# Patient Record
Sex: Female | Born: 1985 | Race: White | Hispanic: No | Marital: Single | Smoking: Never smoker
Health system: Southern US, Community
[De-identification: ages and names within clinical notes are randomized; demographics above are authoritative.]

## PROBLEM LIST (undated history)

## (undated) DIAGNOSIS — I82409 Acute embolism and thrombosis of unspecified deep veins of unspecified lower extremity: Secondary | ICD-10-CM

## (undated) DIAGNOSIS — M1990 Unspecified osteoarthritis, unspecified site: Secondary | ICD-10-CM

## (undated) HISTORY — DX: Unspecified osteoarthritis, unspecified site: M19.90

---
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
## 2007-12-02 ENCOUNTER — Emergency Department: Payer: Self-pay | Admitting: Emergency Medicine

## 2008-07-24 ENCOUNTER — Emergency Department: Payer: Self-pay | Admitting: Emergency Medicine

## 2008-07-25 ENCOUNTER — Emergency Department: Payer: Self-pay | Admitting: Emergency Medicine

## 2009-08-02 ENCOUNTER — Encounter: Payer: Self-pay | Admitting: Obstetrics and Gynecology

## 2009-08-16 ENCOUNTER — Encounter: Payer: Self-pay | Admitting: Maternal and Fetal Medicine

## 2009-10-15 ENCOUNTER — Observation Stay: Payer: Self-pay | Admitting: Obstetrics and Gynecology

## 2009-10-16 ENCOUNTER — Ambulatory Visit: Payer: Self-pay

## 2009-11-01 ENCOUNTER — Encounter: Payer: Self-pay | Admitting: Obstetrics & Gynecology

## 2009-11-05 ENCOUNTER — Encounter: Payer: Self-pay | Admitting: Maternal and Fetal Medicine

## 2009-11-08 ENCOUNTER — Encounter: Payer: Self-pay | Admitting: Obstetrics and Gynecology

## 2009-11-15 ENCOUNTER — Encounter: Payer: Self-pay | Admitting: Maternal and Fetal Medicine

## 2009-11-15 ENCOUNTER — Observation Stay: Payer: Self-pay

## 2016-05-20 DIAGNOSIS — N920 Excessive and frequent menstruation with regular cycle: Secondary | ICD-10-CM

## 2016-05-20 DIAGNOSIS — D649 Anemia, unspecified: Secondary | ICD-10-CM

## 2016-05-20 DIAGNOSIS — Z832 Family history of diseases of the blood and blood-forming organs and certain disorders involving the immune mechanism: Secondary | ICD-10-CM

## 2016-06-16 DIAGNOSIS — M1990 Unspecified osteoarthritis, unspecified site: Secondary | ICD-10-CM | POA: Insufficient documentation

## 2016-06-30 ENCOUNTER — Inpatient Hospital Stay (HOSPITAL_COMMUNITY): Admission: RE | Admit: 2016-06-30 | Payer: Self-pay | Source: Ambulatory Visit

## 2016-07-18 DIAGNOSIS — M0600 Rheumatoid arthritis without rheumatoid factor, unspecified site: Secondary | ICD-10-CM | POA: Insufficient documentation

## 2016-07-18 DIAGNOSIS — Z79899 Other long term (current) drug therapy: Secondary | ICD-10-CM | POA: Insufficient documentation

## 2016-07-31 DIAGNOSIS — D638 Anemia in other chronic diseases classified elsewhere: Secondary | ICD-10-CM

## 2016-07-31 DIAGNOSIS — M069 Rheumatoid arthritis, unspecified: Secondary | ICD-10-CM

## 2017-02-04 DIAGNOSIS — M069 Rheumatoid arthritis, unspecified: Secondary | ICD-10-CM

## 2017-02-04 DIAGNOSIS — D509 Iron deficiency anemia, unspecified: Secondary | ICD-10-CM | POA: Diagnosis not present

## 2017-02-04 DIAGNOSIS — D638 Anemia in other chronic diseases classified elsewhere: Secondary | ICD-10-CM | POA: Diagnosis not present

## 2017-06-29 DIAGNOSIS — M0600 Rheumatoid arthritis without rheumatoid factor, unspecified site: Secondary | ICD-10-CM | POA: Diagnosis not present

## 2017-07-17 DIAGNOSIS — Z1231 Encounter for screening mammogram for malignant neoplasm of breast: Secondary | ICD-10-CM | POA: Diagnosis not present

## 2017-08-04 DIAGNOSIS — D638 Anemia in other chronic diseases classified elsewhere: Secondary | ICD-10-CM

## 2017-08-04 DIAGNOSIS — D509 Iron deficiency anemia, unspecified: Secondary | ICD-10-CM

## 2017-08-04 DIAGNOSIS — M069 Rheumatoid arthritis, unspecified: Secondary | ICD-10-CM

## 2017-08-25 DIAGNOSIS — Z79899 Other long term (current) drug therapy: Secondary | ICD-10-CM | POA: Diagnosis not present

## 2017-08-25 DIAGNOSIS — M1990 Unspecified osteoarthritis, unspecified site: Secondary | ICD-10-CM | POA: Diagnosis not present

## 2017-08-25 DIAGNOSIS — M0600 Rheumatoid arthritis without rheumatoid factor, unspecified site: Secondary | ICD-10-CM | POA: Diagnosis not present

## 2017-10-20 DIAGNOSIS — M0600 Rheumatoid arthritis without rheumatoid factor, unspecified site: Secondary | ICD-10-CM | POA: Diagnosis not present

## 2017-10-20 DIAGNOSIS — M0579 Rheumatoid arthritis with rheumatoid factor of multiple sites without organ or systems involvement: Secondary | ICD-10-CM | POA: Diagnosis not present

## 2017-12-28 DIAGNOSIS — M0600 Rheumatoid arthritis without rheumatoid factor, unspecified site: Secondary | ICD-10-CM | POA: Diagnosis not present

## 2017-12-28 DIAGNOSIS — M1990 Unspecified osteoarthritis, unspecified site: Secondary | ICD-10-CM | POA: Diagnosis not present

## 2017-12-28 DIAGNOSIS — Z79899 Other long term (current) drug therapy: Secondary | ICD-10-CM | POA: Diagnosis not present

## 2018-01-15 DIAGNOSIS — J189 Pneumonia, unspecified organism: Secondary | ICD-10-CM | POA: Diagnosis not present

## 2018-01-15 DIAGNOSIS — M069 Rheumatoid arthritis, unspecified: Secondary | ICD-10-CM | POA: Diagnosis not present

## 2018-03-09 DIAGNOSIS — J019 Acute sinusitis, unspecified: Secondary | ICD-10-CM | POA: Diagnosis not present

## 2018-03-09 DIAGNOSIS — R05 Cough: Secondary | ICD-10-CM | POA: Diagnosis not present

## 2018-03-09 DIAGNOSIS — Z6841 Body Mass Index (BMI) 40.0 and over, adult: Secondary | ICD-10-CM | POA: Diagnosis not present

## 2018-04-12 DIAGNOSIS — Z79899 Other long term (current) drug therapy: Secondary | ICD-10-CM | POA: Diagnosis not present

## 2018-04-12 DIAGNOSIS — M0600 Rheumatoid arthritis without rheumatoid factor, unspecified site: Secondary | ICD-10-CM | POA: Diagnosis not present

## 2018-04-12 DIAGNOSIS — M1990 Unspecified osteoarthritis, unspecified site: Secondary | ICD-10-CM | POA: Diagnosis not present

## 2018-04-21 DIAGNOSIS — B342 Coronavirus infection, unspecified: Secondary | ICD-10-CM | POA: Diagnosis not present

## 2018-06-07 DIAGNOSIS — M0600 Rheumatoid arthritis without rheumatoid factor, unspecified site: Secondary | ICD-10-CM | POA: Diagnosis not present

## 2018-08-02 DIAGNOSIS — M1990 Unspecified osteoarthritis, unspecified site: Secondary | ICD-10-CM | POA: Diagnosis not present

## 2018-08-02 DIAGNOSIS — M0600 Rheumatoid arthritis without rheumatoid factor, unspecified site: Secondary | ICD-10-CM | POA: Diagnosis not present

## 2018-08-02 DIAGNOSIS — Z79899 Other long term (current) drug therapy: Secondary | ICD-10-CM | POA: Diagnosis not present

## 2018-09-27 DIAGNOSIS — M0600 Rheumatoid arthritis without rheumatoid factor, unspecified site: Secondary | ICD-10-CM | POA: Diagnosis not present

## 2018-11-22 DIAGNOSIS — M0600 Rheumatoid arthritis without rheumatoid factor, unspecified site: Secondary | ICD-10-CM | POA: Diagnosis not present

## 2018-12-17 DIAGNOSIS — Z20828 Contact with and (suspected) exposure to other viral communicable diseases: Secondary | ICD-10-CM | POA: Diagnosis not present

## 2019-01-01 DIAGNOSIS — Z20828 Contact with and (suspected) exposure to other viral communicable diseases: Secondary | ICD-10-CM | POA: Diagnosis not present

## 2019-01-11 DIAGNOSIS — M0600 Rheumatoid arthritis without rheumatoid factor, unspecified site: Secondary | ICD-10-CM | POA: Diagnosis not present

## 2019-03-07 DIAGNOSIS — M0600 Rheumatoid arthritis without rheumatoid factor, unspecified site: Secondary | ICD-10-CM | POA: Diagnosis not present

## 2019-05-02 DIAGNOSIS — M25561 Pain in right knee: Secondary | ICD-10-CM | POA: Insufficient documentation

## 2019-05-02 DIAGNOSIS — R0781 Pleurodynia: Secondary | ICD-10-CM | POA: Insufficient documentation

## 2019-05-02 DIAGNOSIS — M1990 Unspecified osteoarthritis, unspecified site: Secondary | ICD-10-CM | POA: Diagnosis not present

## 2019-05-02 DIAGNOSIS — M0600 Rheumatoid arthritis without rheumatoid factor, unspecified site: Secondary | ICD-10-CM | POA: Diagnosis not present

## 2019-05-02 DIAGNOSIS — Z79899 Other long term (current) drug therapy: Secondary | ICD-10-CM | POA: Diagnosis not present

## 2019-05-31 DIAGNOSIS — Z6841 Body Mass Index (BMI) 40.0 and over, adult: Secondary | ICD-10-CM | POA: Diagnosis not present

## 2019-05-31 DIAGNOSIS — R600 Localized edema: Secondary | ICD-10-CM | POA: Diagnosis not present

## 2019-06-01 DIAGNOSIS — I82431 Acute embolism and thrombosis of right popliteal vein: Secondary | ICD-10-CM | POA: Diagnosis not present

## 2019-06-01 DIAGNOSIS — I82411 Acute embolism and thrombosis of right femoral vein: Secondary | ICD-10-CM | POA: Diagnosis not present

## 2019-06-01 DIAGNOSIS — R600 Localized edema: Secondary | ICD-10-CM | POA: Diagnosis not present

## 2019-06-07 DIAGNOSIS — I824Y1 Acute embolism and thrombosis of unspecified deep veins of right proximal lower extremity: Secondary | ICD-10-CM | POA: Diagnosis not present

## 2019-06-07 DIAGNOSIS — M069 Rheumatoid arthritis, unspecified: Secondary | ICD-10-CM | POA: Diagnosis not present

## 2019-06-07 DIAGNOSIS — Z6841 Body Mass Index (BMI) 40.0 and over, adult: Secondary | ICD-10-CM | POA: Diagnosis not present

## 2019-06-13 ENCOUNTER — Other Ambulatory Visit (INDEPENDENT_AMBULATORY_CARE_PROVIDER_SITE_OTHER): Payer: Self-pay | Admitting: Vascular Surgery

## 2019-06-13 DIAGNOSIS — I82411 Acute embolism and thrombosis of right femoral vein: Secondary | ICD-10-CM

## 2019-06-13 DIAGNOSIS — M0600 Rheumatoid arthritis without rheumatoid factor, unspecified site: Secondary | ICD-10-CM | POA: Diagnosis not present

## 2019-06-14 ENCOUNTER — Ambulatory Visit (INDEPENDENT_AMBULATORY_CARE_PROVIDER_SITE_OTHER): Payer: BC Managed Care – PPO | Admitting: Vascular Surgery

## 2019-06-14 ENCOUNTER — Encounter (INDEPENDENT_AMBULATORY_CARE_PROVIDER_SITE_OTHER): Payer: Self-pay | Admitting: Vascular Surgery

## 2019-06-14 ENCOUNTER — Ambulatory Visit (INDEPENDENT_AMBULATORY_CARE_PROVIDER_SITE_OTHER): Payer: BC Managed Care – PPO

## 2019-06-14 ENCOUNTER — Other Ambulatory Visit
Admission: RE | Admit: 2019-06-14 | Discharge: 2019-06-14 | Disposition: A | Discharge status: Home / Self Care | Payer: BC Managed Care – PPO | Source: Ambulatory Visit | Attending: Vascular Surgery | Admitting: Vascular Surgery

## 2019-06-14 ENCOUNTER — Telehealth (INDEPENDENT_AMBULATORY_CARE_PROVIDER_SITE_OTHER): Payer: Self-pay

## 2019-06-14 ENCOUNTER — Other Ambulatory Visit (INDEPENDENT_AMBULATORY_CARE_PROVIDER_SITE_OTHER): Payer: Self-pay | Admitting: Nurse Practitioner

## 2019-06-14 ENCOUNTER — Encounter (INDEPENDENT_AMBULATORY_CARE_PROVIDER_SITE_OTHER): Payer: Self-pay

## 2019-06-14 ENCOUNTER — Other Ambulatory Visit: Payer: Self-pay

## 2019-06-14 VITALS — BP 109/73 | HR 97 | Ht 64.0 in | Wt 302.0 lb

## 2019-06-14 DIAGNOSIS — M0600 Rheumatoid arthritis without rheumatoid factor, unspecified site: Secondary | ICD-10-CM | POA: Diagnosis not present

## 2019-06-14 DIAGNOSIS — Z01812 Encounter for preprocedural laboratory examination: Secondary | ICD-10-CM | POA: Diagnosis not present

## 2019-06-14 DIAGNOSIS — I82409 Acute embolism and thrombosis of unspecified deep veins of unspecified lower extremity: Secondary | ICD-10-CM | POA: Insufficient documentation

## 2019-06-14 DIAGNOSIS — I82411 Acute embolism and thrombosis of right femoral vein: Secondary | ICD-10-CM

## 2019-06-14 DIAGNOSIS — Z20822 Contact with and (suspected) exposure to covid-19: Secondary | ICD-10-CM | POA: Insufficient documentation

## 2019-06-14 NOTE — Assessment & Plan Note (Signed)
I have discussed the case with her rheumatologist who is comfortable with [place] proceeding with a percutaneous procedure at this time.  She has recently gotten infusion for rheumatoid arthritis and her rheumatoid arthritis does make it harder for her to put on compression stockings

## 2019-06-14 NOTE — Telephone Encounter (Signed)
Patient was seen in office today and scheduled for a right leg venous thrombectomy with Dr. Wyn Quaker for 06/15/19 with a 1:00 pm arrival time to the MM. Patient will do covid testing on 06/14/19 before 1:00 pm. Pre-procedure instructions were discussed and the paperwork handed to the patient. Patient did state she was calling her Rheumatologist to make sure it was okay to have the procedure.

## 2019-06-14 NOTE — Progress Notes (Signed)
Patient ID: Kathleen Lindsey, female   DOB: 05-28-1985, 34 y.o.   MRN: [id number]  Chief Complaint  Patient presents with  . New Patient (Initial Visit)    DVT on anti coagulate    HPI Kathleen Lindsey is a 34 y.o. female.  I am asked to see the patient by N. Tobie Lords, [place] for evaluation of a DVT.  A little over 2 weeks ago, the patient began developing numbness, swelling, and pain in the right leg.  This was somewhat out of the blue.  This followed a minor trauma to the right leg.  She had no previous history of DVT or superficial thrombophlebitis to her knowledge.  She had been getting treatment for rheumatoid arthritis and gets regular infusions.  This prompted a DVT study which showed extensive right lower extremity DVT throughout the visualized veins of the right lower extremity at that time.  She was started on anticoagulation which has resulted in mild improvement in her symptoms over the past 2 weeks.  She is referred and she is seen today for further evaluation and treatment.  We repeated an ultrasound today for further evaluation.  The patient has a fairly extensive right lower extremity DVT.  Although she has had some resolution of the popliteal portion of her DVT since her last study almost 2 weeks ago, she still has extensive thrombus throughout the femoral vein and the common femoral vein.  The iliac veins are difficult to opacify given her habitus.   Past Medical History:  Diagnosis Date  . Arthritis      Family History  Problem Relation Age of Onset  . Cancer Mother   . Diabetes Mother   No bleeding or clotting disorders    Social History No alcohol, tobacco, or drug use. Married  Not on File  Current Outpatient Medications  Medication Sig Dispense Refill  . acetaminophen (TYLENOL) 650 MG CR tablet Take by mouth.    Arne Cleveland 5 MG TABS tablet Take 5 mg by mouth 2 (two) times daily.    Marland Kitchen omeprazole (PRILOSEC) 40 MG capsule Take by mouth.      No current facility-administered medications for this visit.      REVIEW OF SYSTEMS (Negative unless checked)  Constitutional: [] Weight loss  [] Fever  [] Chills Cardiac: [] Chest pain   [] Chest pressure   [] Palpitations   [] Shortness of breath when laying flat   [] Shortness of breath at rest   [] Shortness of breath with exertion. Vascular:  [] Pain in legs with walking   [] Pain in legs at rest   [] Pain in legs when laying flat   [] Claudication   [] Pain in feet when walking  [] Pain in feet at rest  [] Pain in feet when laying flat   [x] History of DVT   [x] Phlebitis   [x] Swelling in legs   [] Varicose veins   [] Non-healing ulcers Pulmonary:   [] Uses home oxygen   [] Productive cough   [] Hemoptysis   [] Wheeze  [] COPD   [] Asthma Neurologic:  [] Dizziness  [] Blackouts   [] Seizures   [] History of stroke   [] History of TIA  [] Aphasia   [] Temporary blindness   [] Dysphagia   [] Weakness or numbness in arms   [] Weakness or numbness in legs Musculoskeletal:  [x] Arthritis   [] Joint swelling   [x] Joint pain   [] Low back pain Hematologic:  [] Easy bruising  [] Easy bleeding   [] Hypercoagulable state   [] Anemic  [] Hepatitis Gastrointestinal:  [] Blood in stool   [] Vomiting blood  [] Gastroesophageal reflux/heartburn   []   Abdominal pain Genitourinary:  [] Chronic kidney disease   [] Difficult urination  [] Frequent urination  [] Burning with urination   [] Hematuria Skin:  [] Rashes   [] Ulcers   [] Wounds Psychological:  [] History of anxiety   []  History of major depression.    Physical Exam BP 109/73   Pulse 97   Ht 5\' 4"  (1.626 m)   Wt (!) 302 lb (137 kg)   BMI 51.84 kg/m  Gen:  WD/WN, NAD.  Obese Head: [place]/AT, No temporalis wasting.  Ear/Nose/Throat: Hearing grossly intact, nares w/o erythema or drainage, oropharynx w/o Erythema/Exudate Eyes: Conjunctiva clear, sclera non-icteric  Neck: trachea midline.  No JVD.  Pulmonary:  Good air movement, respirations not labored, no use of accessory muscles  Cardiac: RRR,  no JVD Vascular:  Vessel Right Left  Radial Palpable Palpable                                   Gastrointestinal:. No masses, surgical incisions, or scars. Musculoskeletal: M/S 5/5 throughout.  Extremities without ischemic changes.  No deformity or atrophy.  1-2+ right lower extremity edema. Neurologic: Sensation grossly intact in extremities.  Symmetrical.  Speech is fluent. Motor exam as listed above. Psychiatric: Judgment intact, Mood & affect appropriate for pt's clinical situation. Dermatologic: No rashes or ulcers noted.  No cellulitis or open wounds.    Radiology VAS LOWER EXTREMITY VENOUS (DVT)  Result Date: 06/14/2019  Lower Venous DVTStudy Risk Factors: DVT Right lower extremity. Comparison Study: 06/01/2019 Performing Technologist: RT (R)(VS)  Examination Guidelines: A complete evaluation includes B-mode imaging, spectral Doppler, color Doppler, and power Doppler as needed of all accessible portions of each vessel. Bilateral testing is considered an integral part of a complete examination. Limited examinations for reoccurring indications may be performed as noted. The reflux portion of the exam is performed with the patient in reverse Trendelenburg.  +---------+---------------+---------+-----------+----------+--------------+ RIGHT    CompressibilityPhasicitySpontaneityPropertiesThrombus Aging +---------+---------------+---------+-----------+----------+--------------+ CFV      Partial                                                     +---------+---------------+---------+-----------+----------+--------------+ SFJ      Full                                                        +---------+---------------+---------+-----------+----------+--------------+ FV Prox  Partial                                                     +---------+---------------+---------+-----------+----------+--------------+ FV Mid   Partial                                                      +---------+---------------+---------+-----------+----------+--------------+ FV DistalPartial                                                     +---------+---------------+---------+-----------+----------+--------------+  PFV      Full                                                        +---------+---------------+---------+-----------+----------+--------------+ POP      Full                                                        +---------+---------------+---------+-----------+----------+--------------+ PTV                                                   Not visualized +---------+---------------+---------+-----------+----------+--------------+ PERO                                                  Not visualized +---------+---------------+---------+-----------+----------+--------------+ GSV      Full                                                        +---------+---------------+---------+-----------+----------+--------------+ SSV      Partial                                                     +---------+---------------+---------+-----------+----------+--------------+ Summary: RIGHT: - Findings appear improved from previous examination. - Right CFV, Femoral vein and SSV appear to have non occlusive thrombus. PTV and peroneal veins are not visualized. The popliteal vein, SFJ and deep femoral veins appear compressable.  *See table(s) above for measurements and observations. Electronically signed by Festus Barren MD on 06/14/2019 at 11:11:16 AM.    Final     Labs No results found for this or any previous visit (from the past 2160 hour(s)).  Assessment/Plan:  Seronegative rheumatoid arthritis (HCC) I have discussed the case with her rheumatologist who is comfortable with [place] proceeding with a percutaneous procedure at this time.  She has recently gotten infusion for rheumatoid arthritis and her rheumatoid arthritis does make it  harder for her to put on compression stockings  DVT (deep venous thrombosis) (HCC) The patient has a fairly extensive right lower extremity DVT.  Although she has had some resolution of the popliteal portion of her DVT since her last study almost 2 weeks ago, she still has extensive thrombus throughout the femoral vein and the common femoral vein.  The iliac veins are difficult to opacify given her habitus.  She has little over 2 weeks since the onset of symptoms, and with an extensive DVT in a 34 year old under 3 weeks in duration, I would recommend considering percutaneous thrombectomy to reduce pain and swelling both now on long-term.  We discussed the risks and benefits  of the procedures in good detail today.  We discussed that even with the procedure, some degree of postphlebitic symptoms are likely.  The patient voices her understanding and is agreeable to proceed.  We will get this scheduled for tomorrow to expedite the process since we are basically 2 weeks into her symptoms.      Festus Barren 06/14/2019, 2:35 PM   This note was created with Dragon medical transcription system.  Any errors from dictation are unintentional.

## 2019-06-14 NOTE — Patient Instructions (Signed)

## 2019-06-14 NOTE — Assessment & Plan Note (Signed)
The patient has a fairly extensive right lower extremity DVT.  Although she has had some resolution of the popliteal portion of her DVT since her last study almost 2 weeks ago, she still has extensive thrombus throughout the femoral vein and the common femoral vein.  The iliac veins are difficult to opacify given her habitus.  She has little over 2 weeks since the onset of symptoms, and with an extensive DVT in a 34 year old under 3 weeks in duration, I would recommend considering percutaneous thrombectomy to reduce pain and swelling both now on long-term.  We discussed the risks and benefits of the procedures in good detail today.  We discussed that even with the procedure, some degree of postphlebitic symptoms are likely.  The patient voices her understanding and is agreeable to proceed.  We will get this scheduled for tomorrow to expedite the process since we are basically 2 weeks into her symptoms.

## 2019-06-15 ENCOUNTER — Encounter
Admission: RE | Disposition: A | Discharge status: Home / Self Care | Payer: Self-pay | Source: Home / Self Care | Attending: Vascular Surgery

## 2019-06-15 ENCOUNTER — Other Ambulatory Visit: Payer: Self-pay

## 2019-06-15 ENCOUNTER — Encounter: Payer: Self-pay | Admitting: Vascular Surgery

## 2019-06-15 ENCOUNTER — Ambulatory Visit
Admission: RE | Admit: 2019-06-15 | Discharge: 2019-06-15 | Disposition: A | Discharge status: Home / Self Care | Payer: BC Managed Care – PPO | Attending: Vascular Surgery | Admitting: Vascular Surgery

## 2019-06-15 DIAGNOSIS — I82401 Acute embolism and thrombosis of unspecified deep veins of right lower extremity: Secondary | ICD-10-CM

## 2019-06-15 DIAGNOSIS — M1990 Unspecified osteoarthritis, unspecified site: Secondary | ICD-10-CM | POA: Insufficient documentation

## 2019-06-15 DIAGNOSIS — M0600 Rheumatoid arthritis without rheumatoid factor, unspecified site: Secondary | ICD-10-CM | POA: Insufficient documentation

## 2019-06-15 DIAGNOSIS — Z79899 Other long term (current) drug therapy: Secondary | ICD-10-CM | POA: Diagnosis not present

## 2019-06-15 DIAGNOSIS — Z7901 Long term (current) use of anticoagulants: Secondary | ICD-10-CM | POA: Diagnosis not present

## 2019-06-15 DIAGNOSIS — I82411 Acute embolism and thrombosis of right femoral vein: Secondary | ICD-10-CM | POA: Diagnosis not present

## 2019-06-15 HISTORY — DX: Acute embolism and thrombosis of unspecified deep veins of unspecified lower extremity: I82.409

## 2019-06-15 HISTORY — PX: PERIPHERAL VASCULAR THROMBECTOMY: CATH118306

## 2019-06-15 LAB — SARS CORONAVIRUS 2 (TAT 6-24 HRS): SARS Coronavirus 2: NEGATIVE

## 2019-06-15 LAB — CREATININE, SERUM
Creatinine, Ser: 0.58 mg/dL (ref 0.44–1.00)
GFR calc Af Amer: >60 mL/min (ref >60)
GFR calc non Af Amer: >60 mL/min (ref >60)

## 2019-06-15 LAB — BUN: BUN: 12 mg/dL (ref 6–20)

## 2019-06-15 LAB — PREGNANCY, URINE: Preg Test, Ur: NEGATIVE

## 2019-06-15 SURGERY — PERIPHERAL VASCULAR THROMBECTOMY
Anesthesia: Moderate Sedation | Laterality: Right

## 2019-06-15 MED ORDER — FENTANYL CITRATE (PF) 100 MCG/2ML IJ SOLN
INTRAMUSCULAR | Status: AC
  Filled 2019-06-15: qty 2

## 2019-06-15 MED ORDER — METHYLPREDNISOLONE SODIUM SUCC 125 MG IJ SOLR: 125.0000 mg | Freq: Once | INTRAMUSCULAR | Status: DC | PRN

## 2019-06-15 MED ORDER — FENTANYL CITRATE (PF) 100 MCG/2ML IJ SOLN
INTRAMUSCULAR | Status: DC | PRN
  Administered 2019-06-15: 50 ug via INTRAVENOUS

## 2019-06-15 MED ORDER — MIDAZOLAM HCL 2 MG/2ML IJ SOLN
INTRAMUSCULAR | Status: DC | PRN
  Administered 2019-06-15: 2 mg via INTRAVENOUS

## 2019-06-15 MED ORDER — SODIUM CHLORIDE 0.9 % IV SOLN: INTRAVENOUS | Status: DC

## 2019-06-15 MED ORDER — HYDROMORPHONE HCL 1 MG/ML IJ SOLN: 1.0000 mg | Freq: Once | INTRAMUSCULAR | Status: DC | PRN

## 2019-06-15 MED ORDER — ONDANSETRON HCL 4 MG/2ML IJ SOLN: 4.0000 mg | Freq: Four times a day (QID) | INTRAMUSCULAR | Status: DC | PRN

## 2019-06-15 MED ORDER — HEPARIN SODIUM (PORCINE) 1000 UNIT/ML IJ SOLN
INTRAMUSCULAR | Status: AC
  Filled 2019-06-15: qty 1

## 2019-06-15 MED ORDER — MIDAZOLAM HCL 2 MG/ML PO SYRP: 8.0000 mg | ORAL_SOLUTION | Freq: Once | ORAL | Status: DC | PRN

## 2019-06-15 MED ORDER — DIPHENHYDRAMINE HCL 50 MG/ML IJ SOLN: 50.0000 mg | Freq: Once | INTRAMUSCULAR | Status: DC | PRN

## 2019-06-15 MED ORDER — FAMOTIDINE 20 MG PO TABS: 40.0000 mg | ORAL_TABLET | Freq: Once | ORAL | Status: DC | PRN

## 2019-06-15 MED ORDER — MIDAZOLAM HCL 5 MG/5ML IJ SOLN
INTRAMUSCULAR | Status: AC
  Filled 2019-06-15: qty 5

## 2019-06-15 MED ORDER — CEFAZOLIN SODIUM-DEXTROSE 2-4 GM/100ML-% IV SOLN
2.0000 g | Freq: Once | INTRAVENOUS | Status: AC
  Administered 2019-06-15: 2 g via INTRAVENOUS

## 2019-06-15 MED ORDER — ALTEPLASE 2 MG IJ SOLR
INTRAMUSCULAR | Status: DC | PRN
  Administered 2019-06-15: 4 mg

## 2019-06-15 MED ORDER — CEFAZOLIN SODIUM-DEXTROSE 2-4 GM/100ML-% IV SOLN
INTRAVENOUS | Status: AC
  Filled 2019-06-15: qty 100

## 2019-06-15 SURGICAL SUPPLY — 10 items
CANNULA 5F STIFF (CANNULA) ×2 IMPLANT
CATH BEACON 5 .035 65 KMP TIP (CATHETERS) ×2 IMPLANT
CATH INDIGO 12XTORQ 100 (CATHETERS) ×2 IMPLANT
CATH INDIGO SEP 12 (CATHETERS) ×2 IMPLANT
PACK ANGIOGRAPHY (CUSTOM PROCEDURE TRAY) ×2 IMPLANT
SHEATH PINNACLE 11FRX10 (SHEATH) ×4 IMPLANT
SYR MEDRAD MARK 7 150ML (SYRINGE) ×2 IMPLANT
TUBING CONTRAST HIGH PRESS 72 (TUBING) ×3 IMPLANT
WIRE J 3MM .035X145CM (WIRE) ×3 IMPLANT
WIRE MAGIC TORQUE 260C (WIRE) ×2 IMPLANT

## 2019-06-15 NOTE — Op Note (Signed)
[facility]   OPERATIVE NOTE   PRE-OPERATIVE DIAGNOSIS: extensive RLE DVT  POST-OPERATIVE DIAGNOSIS: same   PROCEDURE: 1. US guidance for vascular access to right popliteal vein  2. Catheter placement into right common iliac vein from right popliteal approach  3. IVC gram and right lower extremity venogram 4.  Catheter directed thrombolysis with 4 mg TPA to the right common femoral and right external iliac vein 5. Mechanical thrombectomy to right common femoral vein and right external iliac vein with the penumbra CAT 12 device    SURGEON: Festus Barren, MD  ASSISTANT(S): none  ANESTHESIA: local with moderate conscious sedation for 25 minutes using 2 mg of Versed and 50 mcg of Fentanyl  ESTIMATED BLOOD LOSS: 350 cc  FINDING(S): 1. Nearly occlusive thrombus in the right common femoral vein and external iliac vein.  The superficial femoral vein and popliteal vein were nearly clear of thrombus.  The common iliac vein had a tail of thrombus from the external iliac vein but was otherwise patent and not stenotic.  The IVC was patent.  SPECIMEN(S): none  INDICATIONS:  Patient is a 34 y.o. female who presents with extensive right lower extremity. Patient has marked leg swelling and pain. Venous intervention is performed to reduce the symtpoms and avoid long term postphlebitic symptoms.   DESCRIPTION: After obtaining full informed written consent, the patient was brought back to the vascular suite and placed supine upon the table.Moderate conscious sedation was administered during a face to face encounter with the patient throughout the procedure with my supervision of the RN administering medicines and monitoring the patient's vital signs, pulse oximetry, telemetry and mental status throughout from the start of the procedure until the patient was taken to the recovery room. After obtaining adequate anesthesia, the patient was prepped and draped in the  standard fashion.  The patient was then placed into the prone position. The right popliteal vein was then accessed under direct ultrasound guidance without difficulty with a micropuncture needle and a permanent image was recorded. I then upsized to an 11Fr sheath over a J wire. Additional heparin was not given as the patient was already on Eliquis. A Kumpe catheter and Magic tourque wire were then advanced into the CFV and images were performed. The right common femoral vein and external iliac vein had fairly extensive nearly occlusive thrombus. I was able to cross the thrombus and stenosis and advance into the common iliac vein which was patent after a tail of thrombus from the external iliac vein and extended into the distal portion. I then used the Kumpe catheter and instilled 4 mg of tpa throughout the right common femoral and external iliac veins.  After this dwelled, I used the Penumbra Cat 12 catheter and evacuated about 350 cc of effluent with mechanical thrombectomy throughout the iliac veins and common femoral veins.  A large amount of thrombus was removed with only a small amount of residual thrombus seen in the common femoral vein and no significant residual thrombus in the iliac veins.  I then elected to terminate the procedure. The sheath was removed and a dressing was placed. She was taken to the recovery room in stable condition having tolerated the procedure well.   COMPLICATIONS: None  CONDITION: Stable  Festus Barren 06/15/2019 12:55 PM

## 2019-06-15 NOTE — H&P (Signed)
[facility] History & Physical Update  The patient was interviewed and re-examined.  The patient's previous History and Physical has been reviewed and is unchanged.  There is no change in the plan of care. We plan to proceed with the scheduled procedure.  Festus Barren, MD  06/15/2019, 10:29 AM

## 2019-06-17 ENCOUNTER — Encounter (INDEPENDENT_AMBULATORY_CARE_PROVIDER_SITE_OTHER): Payer: Self-pay | Admitting: Vascular Surgery

## 2019-06-21 ENCOUNTER — Encounter (INDEPENDENT_AMBULATORY_CARE_PROVIDER_SITE_OTHER): Payer: Self-pay

## 2019-07-12 ENCOUNTER — Other Ambulatory Visit (INDEPENDENT_AMBULATORY_CARE_PROVIDER_SITE_OTHER): Payer: Self-pay | Admitting: Vascular Surgery

## 2019-07-12 DIAGNOSIS — I82411 Acute embolism and thrombosis of right femoral vein: Secondary | ICD-10-CM

## 2019-07-12 DIAGNOSIS — Z9862 Peripheral vascular angioplasty status: Secondary | ICD-10-CM

## 2019-07-13 ENCOUNTER — Other Ambulatory Visit: Payer: Self-pay

## 2019-07-13 ENCOUNTER — Encounter (INDEPENDENT_AMBULATORY_CARE_PROVIDER_SITE_OTHER): Payer: Self-pay | Admitting: Nurse Practitioner

## 2019-07-13 ENCOUNTER — Ambulatory Visit (INDEPENDENT_AMBULATORY_CARE_PROVIDER_SITE_OTHER): Payer: BC Managed Care – PPO

## 2019-07-13 ENCOUNTER — Ambulatory Visit (INDEPENDENT_AMBULATORY_CARE_PROVIDER_SITE_OTHER): Payer: BC Managed Care – PPO | Admitting: Nurse Practitioner

## 2019-07-13 VITALS — BP 112/66 | HR 106 | Ht 64.0 in | Wt 300.0 lb

## 2019-07-13 DIAGNOSIS — I82411 Acute embolism and thrombosis of right femoral vein: Secondary | ICD-10-CM

## 2019-07-13 DIAGNOSIS — Z9862 Peripheral vascular angioplasty status: Secondary | ICD-10-CM

## 2019-07-18 ENCOUNTER — Encounter (INDEPENDENT_AMBULATORY_CARE_PROVIDER_SITE_OTHER): Payer: Self-pay | Admitting: Nurse Practitioner

## 2019-07-18 NOTE — Progress Notes (Signed)
Subjective:    Patient ID: Kathleen Lindsey, female    DOB: Jun 07, 1985, 34 y.o.   MRN: [id number] Chief Complaint  Patient presents with  . Follow-up    4 week [facility] RLE DVT     The patient presents today after thrombectomy on 06/15/2019.  The patient underwent:  PROCEDURE: 1. US guidance for vascular access to right popliteal vein  2. Catheter placement into right common iliac vein from right popliteal approach  3. IVC gram and right lower extremity venogram 4.  Catheter directed thrombolysis with 4 mg TPA to the right common femoral and right external iliac vein 5. Mechanical thrombectomy to right common femoral vein and right external iliac vein with the penumbra CAT 12 device  The patient initially presented to the office as a referral from her primary care provider after developing numbness, swelling and pain in the right lower extremity.  This happened with no obvious provocation however there was a minor trauma to the right lower extremity.  The patient has had no prior history of DVT or superficial venous thrombosis.  However the patient does have a known history of rheumatoid arthritis with regular infusions.  The patient was started on Eliquis at that time and she is still currently taking her Eliquis and is not experiencing any significant issues.  She is tolerating it well.  Today noninvasive studies show no evidence of DVT or superficial venous thrombosis in the right lower extremity.  These are currently improved from the previous studies.  There is an incidental finding that the right small saphenous vein has evidence of reflux.   Review of Systems  Cardiovascular: Positive for leg swelling.  All other systems reviewed and are negative.      Objective:   Physical Exam Vitals reviewed.  Cardiovascular:     Rate and Rhythm: Normal rate.  Pulmonary:     Effort: Pulmonary effort is normal.     Breath sounds: Normal breath sounds.  Musculoskeletal:       Right lower leg: 1+ Edema present.     Left lower leg: 1+ Edema present.  Neurological:     Mental Status: She is alert.  Psychiatric:        Mood and Affect: Mood normal.        Behavior: Behavior normal.        Thought Content: Thought content normal.        Judgment: Judgment normal.     BP 112/66   Pulse (!) 106   Ht 5\' 4"  (1.626 m)   Wt 300 lb (136.1 kg)   LMP 06/15/2019 Comment: Has not been sexually active in 1 month  BMI 51.49 kg/m   Past Medical History:  Diagnosis Date  . Arthritis   . DVT (deep venous thrombosis) (HCC)     Social History   Socioeconomic History  . Marital status: Single    Spouse name: Not on file  . Number of children: Not on file  . Years of education: Not on file  . Highest education level: Not on file  Occupational History  . Not on file  Tobacco Use  . Smoking status: Never Smoker  . Smokeless tobacco: Never Used  Substance and Sexual Activity  . Alcohol use: Yes    Comment: rare  . Drug use: Never  . Sexual activity: Not on file  Other Topics Concern  . Not on file  Social History Narrative  . Not on file   Social Determinants of Health  Financial Resource Strain:   . Difficulty of Paying Living Expenses:   Food Insecurity:   . Worried About Charity fundraiser in the Last Year:   . Arboriculturist in the Last Year:   Transportation Needs:   . Film/video editor (Medical):   Marland Kitchen Lack of Transportation (Non-Medical):   Physical Activity:   . Days of Exercise per Week:   . Minutes of Exercise per Session:   Stress:   . Feeling of Stress :   Social Connections:   . Frequency of Communication with Friends and Family:   . Frequency of Social Gatherings with Friends and Family:   . Attends Religious Services:   . Active Member of Clubs or Organizations:   . Attends Archivist Meetings:   Marland Kitchen Marital Status:   Intimate Partner Violence:   . Fear of Current or Ex-Partner:   . Emotionally Abused:   Marland Kitchen  Physically Abused:   . Sexually Abused:     Past Surgical History:  Procedure Laterality Date  . PERIPHERAL VASCULAR THROMBECTOMY Right 06/15/2019   Procedure: PERIPHERAL VASCULAR THROMBECTOMY;  Surgeon: Algernon Huxley, MD;  Location: [facility];  Service: Cardiovascular;  Laterality: Right;    Family History  Problem Relation Age of Onset  . Cancer Mother   . Diabetes Mother     No Known Allergies     Assessment & Plan:   1. Acute deep vein thrombosis (DVT) of femoral vein of right lower extremity (HCC) The patient is feeling much better following her thrombectomy.  The swelling and pain are greatly reduced.  The patient will remain on Eliquis for 6 months.  The patient is advised to continue with conservative therapy including wearing medical grade 1 compression stockings, elevation and exercise.  Discussed the possibility of postphlebitic symptoms and swelling with patients as well as other symptoms of recurrent DVT.  The patient will return in 4 months to discuss stopping Eliquis as well as to follow-up on symptoms.   Current Outpatient Medications on File Prior to Visit  Medication Sig Dispense Refill  . acetaminophen (TYLENOL) 650 MG CR tablet Take by mouth.    Arne Cleveland 5 MG TABS tablet Take 5 mg by mouth 2 (two) times daily.    Marland Kitchen omeprazole (PRILOSEC) 40 MG capsule Take by mouth.     No current facility-administered medications on file prior to visit.    There are no Patient Instructions on file for this visit. No follow-ups on file.   Kris Hartmann, NP

## 2019-07-22 DIAGNOSIS — R0781 Pleurodynia: Secondary | ICD-10-CM | POA: Diagnosis not present

## 2019-07-22 DIAGNOSIS — J90 Pleural effusion, not elsewhere classified: Secondary | ICD-10-CM | POA: Diagnosis not present

## 2019-07-22 DIAGNOSIS — R0602 Shortness of breath: Secondary | ICD-10-CM | POA: Diagnosis not present

## 2019-07-25 ENCOUNTER — Emergency Department: Payer: BC Managed Care – PPO

## 2019-07-25 ENCOUNTER — Other Ambulatory Visit: Payer: Self-pay

## 2019-07-25 ENCOUNTER — Encounter: Payer: Self-pay | Admitting: Emergency Medicine

## 2019-07-25 ENCOUNTER — Emergency Department
Admission: EM | Admit: 2019-07-25 | Discharge: 2019-07-25 | Disposition: A | Discharge status: Home / Self Care | Payer: BC Managed Care – PPO | Attending: Student in an Organized Health Care Education/Training Program | Admitting: Student in an Organized Health Care Education/Training Program

## 2019-07-25 DIAGNOSIS — J90 Pleural effusion, not elsewhere classified: Secondary | ICD-10-CM | POA: Diagnosis not present

## 2019-07-25 DIAGNOSIS — J986 Disorders of diaphragm: Secondary | ICD-10-CM | POA: Diagnosis not present

## 2019-07-25 DIAGNOSIS — R0781 Pleurodynia: Secondary | ICD-10-CM | POA: Diagnosis not present

## 2019-07-25 DIAGNOSIS — E611 Iron deficiency: Secondary | ICD-10-CM | POA: Diagnosis not present

## 2019-07-25 DIAGNOSIS — R0602 Shortness of breath: Secondary | ICD-10-CM

## 2019-07-25 DIAGNOSIS — R918 Other nonspecific abnormal finding of lung field: Secondary | ICD-10-CM | POA: Diagnosis not present

## 2019-07-25 DIAGNOSIS — J9811 Atelectasis: Secondary | ICD-10-CM | POA: Diagnosis not present

## 2019-07-25 DIAGNOSIS — Z7901 Long term (current) use of anticoagulants: Secondary | ICD-10-CM | POA: Insufficient documentation

## 2019-07-25 DIAGNOSIS — M0600 Rheumatoid arthritis without rheumatoid factor, unspecified site: Secondary | ICD-10-CM | POA: Diagnosis not present

## 2019-07-25 DIAGNOSIS — K449 Diaphragmatic hernia without obstruction or gangrene: Secondary | ICD-10-CM | POA: Diagnosis not present

## 2019-07-25 DIAGNOSIS — R079 Chest pain, unspecified: Secondary | ICD-10-CM | POA: Diagnosis not present

## 2019-07-25 LAB — BASIC METABOLIC PANEL
Anion gap: 11 (ref 5–15)
BUN: 15 mg/dL (ref 6–20)
CO2: 26 mmol/L (ref 22–32)
Calcium: 9.3 mg/dL (ref 8.9–10.3)
Chloride: 97 mmol/L — ABNORMAL LOW (ref 98–111)
Creatinine, Ser: 0.65 mg/dL (ref 0.44–1.00)
GFR calc Af Amer: >60 mL/min (ref >60)
GFR calc non Af Amer: >60 mL/min (ref >60)
Glucose, Bld: 106 mg/dL — ABNORMAL HIGH (ref 70–99)
Potassium: 3.7 mmol/L (ref 3.5–5.1)
Sodium: 134 mmol/L — ABNORMAL LOW (ref 135–145)

## 2019-07-25 LAB — BRAIN NATRIURETIC PEPTIDE: B Natriuretic Peptide: 31.2 pg/mL (ref 0.0–100.0)

## 2019-07-25 LAB — CBC
HCT: 34.8 % — ABNORMAL LOW (ref 36.0–46.0)
Hemoglobin: 10.6 g/dL — ABNORMAL LOW (ref 12.0–15.0)
MCH: 23.9 pg — ABNORMAL LOW (ref 26.0–34.0)
MCHC: 30.5 g/dL (ref 30.0–36.0)
MCV: 78.6 fL — ABNORMAL LOW (ref 80.0–100.0)
Platelets: 477 10*3/uL — ABNORMAL HIGH (ref 150–400)
RBC: 4.43 MIL/uL (ref 3.87–5.11)
RDW: 16.1 % — ABNORMAL HIGH (ref 11.5–15.5)
WBC: 11.2 10*3/uL — ABNORMAL HIGH (ref 4.0–10.5)
nRBC: 0 % (ref 0.0–0.2)

## 2019-07-25 LAB — TROPONIN I (HIGH SENSITIVITY)
Troponin I (High Sensitivity): 3 ng/L (ref <18)
Troponin I (High Sensitivity): <2 ng/L (ref <18)

## 2019-07-25 MED ORDER — IPRATROPIUM-ALBUTEROL 0.5-2.5 (3) MG/3ML IN SOLN
3.0000 mL | Freq: Once | RESPIRATORY_TRACT | Status: AC
  Administered 2019-07-25: 3 mL via RESPIRATORY_TRACT
  Filled 2019-07-25: qty 3

## 2019-07-25 MED ORDER — ALBUTEROL SULFATE HFA 108 (90 BASE) MCG/ACT IN AERS
2.0000 | INHALATION_SPRAY | Freq: Four times a day (QID) | RESPIRATORY_TRACT | 1 refills | Status: AC | PRN
Start: 2019-07-25 — End: ?

## 2019-07-25 MED ORDER — IOHEXOL 350 MG/ML SOLN
75.0000 mL | Freq: Once | INTRAVENOUS | Status: AC | PRN
  Administered 2019-07-25: 75 mL via INTRAVENOUS

## 2019-07-25 NOTE — ED Triage Notes (Signed)
Patient presents to the ED with shortness of breath from her rheumatologist office.  Patient recently had a DVT and a thrombectomy.  Patient is having difficulty speaking in full sentences without having to catch her breath.  Patient states she has been short of breath for about 1 week.  Patient reports some chest pain when she takes a deep breath.

## 2019-07-25 NOTE — ED Provider Notes (Signed)
[facility] Emergency Department Provider Note    First MD Initiated Contact with Patient 07/25/19 1026     (approximate)  I have reviewed the triage vital signs and the nursing notes.   HISTORY  Chief Complaint Shortness of Breath    HPI Kathleen Lindsey is a 34 y.o. female debilitated medical history currently on Eliquis status post recent thrombectomy for DVT presents to the ER for worsening shortness of breath. States his shortness of breath has been ongoing for the past several days and actually feels like it is getting somewhat better but was told by her rheumatologist to come to the ER due to concern for her description of some pleuritic chest pain over the past few days and concern for PE. As any abdominal pain. Is primarily shortness of breath that is her complaint,  Less so pain.    Past Medical History:  Diagnosis Date  . Arthritis   . DVT (deep venous thrombosis) (HCC)    Family History  Problem Relation Age of Onset  . Cancer Mother   . Diabetes Mother    Past Surgical History:  Procedure Laterality Date  . PERIPHERAL VASCULAR THROMBECTOMY Right 06/15/2019   Procedure: PERIPHERAL VASCULAR THROMBECTOMY;  Surgeon: Annice Needy, MD;  Location: [facility] INVASIVE CV LAB;  Service: Cardiovascular;  Laterality: Right;   Patient Active Problem List   Diagnosis Date Noted  . DVT (deep venous thrombosis) (HCC) 06/14/2019  . Chest pain at rest 05/02/2019  . Right knee pain 05/02/2019  . Encounter for long-term (current) use of high-risk medication 07/18/2016  . Seronegative rheumatoid arthritis (HCC) 07/18/2016  . Chronic inflammatory arthritis 06/16/2016      Prior to Admission medications   Medication Sig Start Date End Date Taking? Authorizing Provider  acetaminophen (TYLENOL) 650 MG CR tablet Take by mouth.    [provider]  albuterol (VENTOLIN HFA) 108 (90 Base) MCG/ACT inhaler Inhale 2 puffs into the lungs every 6  (six) hours as needed for wheezing or shortness of breath. 07/25/19   Willy Eddy, MD  ELIQUIS 5 MG TABS tablet Take 5 mg by mouth 2 (two) times daily. 06/01/19   [provider]  omeprazole (PRILOSEC) 40 MG capsule Take by mouth.    [provider]    Allergies Patient has no known allergies.    Social History Social History   Tobacco Use  . Smoking status: Never Smoker  . Smokeless tobacco: Never Used  Substance Use Topics  . Alcohol use: Yes    Comment: rare  . Drug use: Never    Review of Systems Patient denies headaches, rhinorrhea, blurry vision, numbness, shortness of breath, chest pain, edema, cough, abdominal pain, nausea, vomiting, diarrhea, dysuria, fevers, rashes or hallucinations unless otherwise stated above in HPI. ____________________________________________   PHYSICAL EXAM:  VITAL SIGNS: Vitals:   07/25/19 1200 07/25/19 1244  BP: (!) 139/93 (!) 132/91  Pulse: 100 98  Resp: (!) 21 (!) 21  Temp:    SpO2: 96% 95%    Constitutional: Alert and oriented.  Eyes: Conjunctivae are normal.  Head: Atraumatic. Nose: No congestion/rhinnorhea. Mouth/Throat: Mucous membranes are moist.   Neck: No stridor. Painless ROM.  Cardiovascular: Normal rate, regular rhythm. Grossly normal heart sounds.  Good peripheral circulation. Respiratory: Normal respiratory effort.  No retractions. Lungs CTAB. Gastrointestinal: Soft and nontender. No distention. No abdominal bruits. No CVA tenderness. Genitourinary:  Musculoskeletal: No lower extremity tenderness nor edema.  No joint effusions. Neurologic:  Normal speech  and language. No gross focal neurologic deficits are appreciated. No facial droop Skin:  Skin is warm, dry and intact. No rash noted. Psychiatric: Mood and affect are normal. Speech and behavior are normal.  ____________________________________________   LABS (all labs ordered are listed, but only abnormal results are displayed)  Results  for orders placed or performed during the hospital encounter of 07/25/19 (from the past 24 hour(s))  Basic metabolic panel     Status: Abnormal   Collection Time: 07/25/19  9:40 AM  Result Value Ref Range   Sodium 134 (L) 135 - 145 mmol/L   Potassium 3.7 3.5 - 5.1 mmol/L   Chloride 97 (L) 98 - 111 mmol/L   CO2 26 22 - 32 mmol/L   Glucose, Bld 106 (H) 70 - 99 mg/dL   BUN 15 6 - 20 mg/dL   Creatinine, Ser 0.65 0.44 - 1.00 mg/dL   Calcium 9.3 8.9 - 10.3 mg/dL   GFR calc non Af Amer >60 >60 mL/min   GFR calc Af Amer >60 >60 mL/min   Anion gap 11 5 - 15  CBC     Status: Abnormal   Collection Time: 07/25/19  9:40 AM  Result Value Ref Range   WBC 11.2 (H) 4.0 - 10.5 K/uL   RBC 4.43 3.87 - 5.11 MIL/uL   Hemoglobin 10.6 (L) 12.0 - 15.0 g/dL   HCT 34.8 (L) 36 - 46 %   MCV 78.6 (L) 80.0 - 100.0 fL   MCH 23.9 (L) 26.0 - 34.0 pg   MCHC 30.5 30.0 - 36.0 g/dL   RDW 16.1 (H) 11.5 - 15.5 %   Platelets 477 (H) 150 - 400 K/uL   nRBC 0.0 0.0 - 0.2 %  Troponin I (High Sensitivity)     Status: None   Collection Time: 07/25/19  9:40 AM  Result Value Ref Range   Troponin I (High Sensitivity) 3 <18 ng/L  Troponin I (High Sensitivity)     Status: None   Collection Time: 07/25/19 12:45 PM  Result Value Ref Range   Troponin I (High Sensitivity) <2 <18 ng/L   ____________________________________________  EKG My review and personal interpretation at Time: 9:47   Indication: sob  Rate: 105  Rhythm: sinus Axis: normal Other: normal intervals, no stemi ____________________________________________  RADIOLOGY  I personally reviewed all radiographic images ordered to evaluate for the above acute complaints and reviewed radiology reports and findings.  These findings were personally discussed with the patient.  Please see medical record for radiology report.  ____________________________________________   PROCEDURES  Procedure(s) performed:  Procedures    Critical Care performed:  no ____________________________________________   INITIAL IMPRESSION / ASSESSMENT AND PLAN / ED COURSE  Pertinent labs & imaging results that were available during my care of the patient were reviewed by me and considered in my medical decision making (see chart for details).   DDX: Asthma, copd, CHF, pna, ptx, malignancy, Pe, anemia  Kathleen Lindsey is a 34 y.o. who presents to the ED with shortness of breath as described above.  Patient is nontoxic-appearing no hypoxia.  Somewhat complicated history given her recent thrombectomy but is already on Eliquis but given her worsening shortness of breath will order CTA to evaluate for PE.  Have lower suspicion ofr pna or chf.  Doubt ACS.  Clinical Course as of Jul 25 1447  Mon Jul 25, 2019  1442 Patient reassessed.  No hypoxia on ambulation.  Enzymes are negative.  Blood work is otherwise reassuring.  No signs of pulmonary edema or acute CHF.  She has outpatient echo scheduled.  She states that she has been feeling better over the past few days after starting steroids but still feeling some chronic shortness of breath.  Will order albuterol for complaint of possible bronchitis but at this point believe she is appropriate for outpatient follow-up.   [PR]    Clinical Course User Index [PR] Willy Eddy, MD    The patient was evaluated in Emergency Department today for the symptoms described in the history of present illness. He/she was evaluated in the context of the global COVID-19 pandemic, which necessitated consideration that the patient might be at risk for infection with the SARS-CoV-2 virus that causes COVID-19. Institutional protocols and algorithms that pertain to the evaluation of patients at risk for COVID-19 are in a state of rapid change based on information released by regulatory bodies including the CDC and federal and state organizations. These policies and algorithms were followed during the patient's care in the  ED.  As part of my medical decision making, I reviewed the following data within the electronic MEDICAL RECORD NUMBER Nursing notes reviewed and incorporated, Labs reviewed, notes from prior ED visits and [place] Controlled Substance Database   ____________________________________________   FINAL CLINICAL IMPRESSION(S) / ED DIAGNOSES  Final diagnoses:  SOB (shortness of breath)      NEW MEDICATIONS STARTED DURING THIS VISIT:  New Prescriptions   ALBUTEROL (VENTOLIN HFA) 108 (90 BASE) MCG/ACT INHALER    Inhale 2 puffs into the lungs every 6 (six) hours as needed for wheezing or shortness of breath.     Note:  This document was prepared using Dragon voice recognition software and may include unintentional dictation errors.    Willy Eddy, MD 07/25/19 [phone]

## 2019-07-25 NOTE — ED Notes (Signed)
Pt ambulated around the room for approx 2 minutes. Pt O2 sat maintained between 92%-98%

## 2019-08-08 DIAGNOSIS — M0600 Rheumatoid arthritis without rheumatoid factor, unspecified site: Secondary | ICD-10-CM | POA: Diagnosis not present

## 2019-08-08 DIAGNOSIS — Z79899 Other long term (current) drug therapy: Secondary | ICD-10-CM | POA: Diagnosis not present

## 2019-08-08 DIAGNOSIS — R0602 Shortness of breath: Secondary | ICD-10-CM | POA: Diagnosis not present

## 2019-08-15 DIAGNOSIS — M0600 Rheumatoid arthritis without rheumatoid factor, unspecified site: Secondary | ICD-10-CM | POA: Diagnosis not present

## 2019-08-18 DIAGNOSIS — I824Y1 Acute embolism and thrombosis of unspecified deep veins of right proximal lower extremity: Secondary | ICD-10-CM | POA: Diagnosis not present

## 2019-08-18 DIAGNOSIS — D509 Iron deficiency anemia, unspecified: Secondary | ICD-10-CM | POA: Diagnosis not present

## 2019-08-18 DIAGNOSIS — R0602 Shortness of breath: Secondary | ICD-10-CM | POA: Diagnosis not present

## 2019-08-18 DIAGNOSIS — M069 Rheumatoid arthritis, unspecified: Secondary | ICD-10-CM | POA: Diagnosis not present

## 2019-08-26 DIAGNOSIS — R0602 Shortness of breath: Secondary | ICD-10-CM | POA: Diagnosis not present

## 2019-09-02 DIAGNOSIS — R0600 Dyspnea, unspecified: Secondary | ICD-10-CM | POA: Diagnosis not present

## 2019-09-02 DIAGNOSIS — E669 Obesity, unspecified: Secondary | ICD-10-CM | POA: Diagnosis not present

## 2019-09-02 DIAGNOSIS — G479 Sleep disorder, unspecified: Secondary | ICD-10-CM | POA: Diagnosis not present

## 2019-09-02 DIAGNOSIS — J986 Disorders of diaphragm: Secondary | ICD-10-CM | POA: Diagnosis not present

## 2019-09-08 ENCOUNTER — Other Ambulatory Visit: Payer: Self-pay | Admitting: Specialist

## 2019-09-08 DIAGNOSIS — R0602 Shortness of breath: Secondary | ICD-10-CM

## 2019-09-14 ENCOUNTER — Other Ambulatory Visit: Payer: Self-pay

## 2019-09-14 ENCOUNTER — Ambulatory Visit
Admission: RE | Admit: 2019-09-14 | Discharge: 2019-09-14 | Disposition: A | Discharge status: Home / Self Care | Payer: BC Managed Care – PPO | Source: Ambulatory Visit | Attending: Specialist | Admitting: Specialist

## 2019-09-14 DIAGNOSIS — R0602 Shortness of breath: Secondary | ICD-10-CM | POA: Diagnosis not present

## 2019-09-21 DIAGNOSIS — M0600 Rheumatoid arthritis without rheumatoid factor, unspecified site: Secondary | ICD-10-CM | POA: Diagnosis not present

## 2019-10-07 DIAGNOSIS — M65312 Trigger thumb, left thumb: Secondary | ICD-10-CM | POA: Diagnosis not present

## 2019-10-18 DIAGNOSIS — M0600 Rheumatoid arthritis without rheumatoid factor, unspecified site: Secondary | ICD-10-CM | POA: Diagnosis not present

## 2019-10-30 DIAGNOSIS — G4733 Obstructive sleep apnea (adult) (pediatric): Secondary | ICD-10-CM | POA: Diagnosis not present

## 2019-11-04 DIAGNOSIS — Z8739 Personal history of other diseases of the musculoskeletal system and connective tissue: Secondary | ICD-10-CM | POA: Diagnosis not present

## 2019-11-04 DIAGNOSIS — R0600 Dyspnea, unspecified: Secondary | ICD-10-CM | POA: Diagnosis not present

## 2019-11-04 DIAGNOSIS — J986 Disorders of diaphragm: Secondary | ICD-10-CM | POA: Diagnosis not present

## 2019-11-11 DIAGNOSIS — R0781 Pleurodynia: Secondary | ICD-10-CM | POA: Diagnosis not present

## 2019-11-11 DIAGNOSIS — R0602 Shortness of breath: Secondary | ICD-10-CM | POA: Diagnosis not present

## 2019-11-11 DIAGNOSIS — Z79899 Other long term (current) drug therapy: Secondary | ICD-10-CM | POA: Diagnosis not present

## 2019-11-11 DIAGNOSIS — M0600 Rheumatoid arthritis without rheumatoid factor, unspecified site: Secondary | ICD-10-CM | POA: Diagnosis not present

## 2019-11-15 DIAGNOSIS — M0600 Rheumatoid arthritis without rheumatoid factor, unspecified site: Secondary | ICD-10-CM | POA: Diagnosis not present

## 2019-11-18 DIAGNOSIS — I824Y1 Acute embolism and thrombosis of unspecified deep veins of right proximal lower extremity: Secondary | ICD-10-CM | POA: Diagnosis not present

## 2019-11-18 DIAGNOSIS — Z23 Encounter for immunization: Secondary | ICD-10-CM | POA: Diagnosis not present

## 2019-11-18 DIAGNOSIS — G4733 Obstructive sleep apnea (adult) (pediatric): Secondary | ICD-10-CM | POA: Diagnosis not present

## 2019-11-18 DIAGNOSIS — M069 Rheumatoid arthritis, unspecified: Secondary | ICD-10-CM | POA: Diagnosis not present

## 2019-11-18 DIAGNOSIS — R0602 Shortness of breath: Secondary | ICD-10-CM | POA: Diagnosis not present

## 2019-12-02 ENCOUNTER — Telehealth (INDEPENDENT_AMBULATORY_CARE_PROVIDER_SITE_OTHER): Payer: Self-pay

## 2019-12-02 NOTE — Telephone Encounter (Signed)
I would continue until she has had her follow up

## 2019-12-02 NOTE — Telephone Encounter (Signed)
I called the pt and made her aware of the NP's instructions. 

## 2019-12-02 NOTE — Telephone Encounter (Signed)
The pt called and left a VM on the nurses line saying that she has am appointment on the 23 rd of November  to see Dr. Wyn Quaker to Discuss coming off of her blood thinner. The pt wanted to know should she continue to fill the medication until her appointment or let it run out. Please Advise.

## 2019-12-13 DIAGNOSIS — M0600 Rheumatoid arthritis without rheumatoid factor, unspecified site: Secondary | ICD-10-CM | POA: Diagnosis not present

## 2019-12-20 ENCOUNTER — Other Ambulatory Visit: Payer: Self-pay

## 2019-12-20 ENCOUNTER — Encounter (INDEPENDENT_AMBULATORY_CARE_PROVIDER_SITE_OTHER): Payer: Self-pay | Admitting: Vascular Surgery

## 2019-12-20 ENCOUNTER — Ambulatory Visit (INDEPENDENT_AMBULATORY_CARE_PROVIDER_SITE_OTHER): Payer: BC Managed Care – PPO | Admitting: Vascular Surgery

## 2019-12-20 VITALS — BP 170/90 | HR 98 | Resp 16 | Wt 293.4 lb

## 2019-12-20 DIAGNOSIS — I82411 Acute embolism and thrombosis of right femoral vein: Secondary | ICD-10-CM | POA: Diagnosis not present

## 2019-12-20 DIAGNOSIS — M0600 Rheumatoid arthritis without rheumatoid factor, unspecified site: Secondary | ICD-10-CM | POA: Diagnosis not present

## 2019-12-20 NOTE — Progress Notes (Signed)
MRN : [id number]  Kathleen Lindsey is a 34 y.o. (10-Dec-1985) female who presents with chief complaint of  Chief Complaint  Patient presents with  . Follow-up    29month follow up  .  History of Present Illness: Patient returns today in follow up of her right leg DVT.  This was treated with thrombectomy about 5 to 6 months ago.  She has tolerated anticoagulation for the most part although her rheumatoid arthritis has been flaring up and not being able to take NSAIDs has been somewhat problematic.  She has also had some heavier menstrual cycles.  Her legs are doing great.  She has infrequent leg swelling and no real pain.  Current Outpatient Medications  Medication Sig Dispense Refill  . acetaminophen (TYLENOL) 650 MG CR tablet Take by mouth.    Marland Kitchen albuterol (VENTOLIN HFA) 108 (90 Base) MCG/ACT inhaler Inhale 2 puffs into the lungs every 6 (six) hours as needed for wheezing or shortness of breath. 8 g 1  . colchicine 0.6 MG tablet Take 0.6 mg by mouth 2 (two) times daily.    Marland Kitchen ELIQUIS 5 MG TABS tablet Take 5 mg by mouth 2 (two) times daily.    . ferrous sulfate 325 (65 FE) MG tablet Take 325 mg by mouth daily.    Marland Kitchen HYDROcodone-acetaminophen (NORCO/VICODIN) 5-325 MG tablet Take 1 tablet by mouth 2 (two) times daily as needed.    Marland Kitchen omeprazole (PRILOSEC) 40 MG capsule Take by mouth.    . phentermine 37.5 MG capsule Take 37.5 mg by mouth every morning.    . predniSONE (DELTASONE) 10 MG tablet Take 10 mg by mouth daily.     No current facility-administered medications for this visit.    Past Medical History:  Diagnosis Date  . Arthritis   . DVT (deep venous thrombosis) (HCC)     Past Surgical History:  Procedure Laterality Date  . PERIPHERAL VASCULAR THROMBECTOMY Right 06/15/2019   Procedure: PERIPHERAL VASCULAR THROMBECTOMY;  Surgeon: Annice Needy, MD;  Location: [facility] INVASIVE CV LAB;  Service: Cardiovascular;  Laterality: Right;     Social History   Tobacco Use  .  Smoking status: Never Smoker  . Smokeless tobacco: Never Used  Substance Use Topics  . Alcohol use: Yes    Comment: rare  . Drug use: Never      Family History  Problem Relation Age of Onset  . Cancer Mother   . Diabetes Mother   no bleeding or clotting disorders  No Known Allergies   REVIEW OF SYSTEMS (Negative unless checked)  Constitutional: [] Weight loss  [] Fever  [] Chills Cardiac: [] Chest pain   [] Chest pressure   [] Palpitations   [] Shortness of breath when laying flat   [] Shortness of breath at rest   [] Shortness of breath with exertion. Vascular:  [] Pain in legs with walking   [] Pain in legs at rest   [] Pain in legs when laying flat   [] Claudication   [] Pain in feet when walking  [] Pain in feet at rest  [] Pain in feet when laying flat   [x] History of DVT   [x] Phlebitis   [x] Swelling in legs   [] Varicose veins   [] Non-healing ulcers Pulmonary:   [] Uses home oxygen   [] Productive cough   [] Hemoptysis   [] Wheeze  [] COPD   [] Asthma Neurologic:  [] Dizziness  [] Blackouts   [] Seizures   [] History of stroke   [] History of TIA  [] Aphasia   [] Temporary blindness   [] Dysphagia   [] Weakness or numbness  in arms   [] Weakness or numbness in legs Musculoskeletal:  [x] Arthritis   [] Joint swelling   [] Joint pain   [] Low back pain Hematologic:  [] Easy bruising  [] Easy bleeding   [] Hypercoagulable state   [] Anemic   Gastrointestinal:  [] Blood in stool   [] Vomiting blood  [] Gastroesophageal reflux/heartburn   [] Abdominal pain Genitourinary:  [] Chronic kidney disease   [] Difficult urination  [] Frequent urination  [] Burning with urination   [] Hematuria Skin:  [] Rashes   [] Ulcers   [] Wounds Psychological:  [] History of anxiety   []  History of major depression.  Physical Examination  BP (!) 170/90 (BP Location: Right Arm)   Pulse 98   Resp 16   Wt 293 lb 6.4 oz (133.1 kg)   BMI 51.97 kg/m  Gen:  WD/WN, NAD.  Obese Head: [place]/AT, No temporalis wasting. Ear/Nose/Throat: Hearing grossly intact,  nares w/o erythema or drainage Eyes: Conjunctiva clear. Sclera non-icteric Neck: Supple.  Trachea midline Pulmonary:  Good air movement, no use of accessory muscles.  Cardiac: RRR, no JVD Vascular:  Vessel Right Left  Radial Palpable Palpable                          PT Palpable Palpable  DP Palpable Palpable    Musculoskeletal: M/S 5/5 throughout.  No deformity or atrophy.  No edema. Neurologic: Sensation grossly intact in extremities.  Symmetrical.  Speech is fluent.  Psychiatric: Judgment intact, Mood & affect appropriate for pt's clinical situation. Dermatologic: No rashes or ulcers noted.  No cellulitis or open wounds.       Labs No results found for this or any previous visit (from the past 2160 hour(s)).  Radiology No results found.  Assessment/Plan  Seronegative rheumatoid arthritis (HCC) Not being able to take NSAIDs has been problematic as she has not been able to get her infusions and her RA has had intermittent flares.  Lowering her dose of Eliquis would allow her to take intermittent NSAIDs with a low risk of bleeding.  DVT (deep venous thrombosis) (HCC) The patient has undergone 6 months of anticoagulation and tolerated this well for her DVT.  She had a very large DVT and she is at high risk for recurrent thromboembolic problems particularly with her size, intermittent need for steroids with her RA, and previous unprovoked DVT.  I think remaining on some form of anticoagulation would be prudent at this time.  Given her inability to take NSAIDs now on full dose Eliquis, and the promising results from the low-dose trials ongoing, I think lowering her dose of Eliquis to 2.5 mg twice daily would be prudent at this point.  We will reassess her in 6 months.    , MD  12/20/2019 9:48 AM    This note was created with Dragon medical transcription system.  Any errors from dictation are purely unintentional

## 2019-12-20 NOTE — Assessment & Plan Note (Signed)
The patient has undergone 6 months of anticoagulation and tolerated this well for her DVT.  She had a very large DVT and she is at high risk for recurrent thromboembolic problems particularly with her size, intermittent need for steroids with her RA, and previous unprovoked DVT.  I think remaining on some form of anticoagulation would be prudent at this time.  Given her inability to take NSAIDs now on full dose Eliquis, and the promising results from the low-dose trials ongoing, I think lowering her dose of Eliquis to 2.5 mg twice daily would be prudent at this point.  We will reassess her in 6 months.

## 2019-12-20 NOTE — Assessment & Plan Note (Signed)
Not being able to take NSAIDs has been problematic as she has not been able to get her infusions and her RA has had intermittent flares.  Lowering her dose of Eliquis would allow her to take intermittent NSAIDs with a low risk of bleeding.

## 2020-01-10 DIAGNOSIS — M0600 Rheumatoid arthritis without rheumatoid factor, unspecified site: Secondary | ICD-10-CM | POA: Diagnosis not present

## 2020-01-24 DIAGNOSIS — M0600 Rheumatoid arthritis without rheumatoid factor, unspecified site: Secondary | ICD-10-CM | POA: Diagnosis not present

## 2020-02-02 ENCOUNTER — Telehealth (INDEPENDENT_AMBULATORY_CARE_PROVIDER_SITE_OTHER): Payer: Self-pay

## 2020-02-02 NOTE — Telephone Encounter (Signed)
Patient called wanting to know should she stop her Eliquis for a filling and cleaning at the dentist office. Per Dr. Gilda Crease the patient does not have to stop the Eliquis. Patient was given this recommendation and stated she usually bleeds during dental appt. I advised that if her dentist want her to stop her Eliquis to have them send over a clearance to stop and we will get it signed and sent back.

## 2020-02-07 DIAGNOSIS — M0600 Rheumatoid arthritis without rheumatoid factor, unspecified site: Secondary | ICD-10-CM | POA: Diagnosis not present

## 2020-02-09 DIAGNOSIS — Z20828 Contact with and (suspected) exposure to other viral communicable diseases: Secondary | ICD-10-CM | POA: Diagnosis not present

## 2020-02-15 DIAGNOSIS — U071 COVID-19: Secondary | ICD-10-CM | POA: Diagnosis not present

## 2020-03-07 DIAGNOSIS — Z79899 Other long term (current) drug therapy: Secondary | ICD-10-CM | POA: Diagnosis not present

## 2020-03-07 DIAGNOSIS — R0781 Pleurodynia: Secondary | ICD-10-CM | POA: Diagnosis not present

## 2020-03-07 DIAGNOSIS — M0600 Rheumatoid arthritis without rheumatoid factor, unspecified site: Secondary | ICD-10-CM | POA: Diagnosis not present

## 2020-03-07 DIAGNOSIS — R499 Unspecified voice and resonance disorder: Secondary | ICD-10-CM | POA: Diagnosis not present

## 2020-03-27 DIAGNOSIS — K219 Gastro-esophageal reflux disease without esophagitis: Secondary | ICD-10-CM | POA: Diagnosis not present

## 2020-03-27 DIAGNOSIS — R490 Dysphonia: Secondary | ICD-10-CM | POA: Diagnosis not present

## 2020-03-27 DIAGNOSIS — M0570 Rheumatoid arthritis with rheumatoid factor of unspecified site without organ or systems involvement: Secondary | ICD-10-CM | POA: Diagnosis not present

## 2020-03-27 DIAGNOSIS — G4733 Obstructive sleep apnea (adult) (pediatric): Secondary | ICD-10-CM | POA: Diagnosis not present

## 2020-04-04 DIAGNOSIS — M0600 Rheumatoid arthritis without rheumatoid factor, unspecified site: Secondary | ICD-10-CM | POA: Diagnosis not present

## 2020-04-10 ENCOUNTER — Other Ambulatory Visit: Payer: Self-pay

## 2020-04-10 ENCOUNTER — Ambulatory Visit: Payer: BC Managed Care – PPO | Attending: Otolaryngology | Admitting: Speech Pathology

## 2020-04-10 DIAGNOSIS — R490 Dysphonia: Secondary | ICD-10-CM | POA: Insufficient documentation

## 2020-04-10 DIAGNOSIS — J382 Nodules of vocal cords: Secondary | ICD-10-CM | POA: Diagnosis not present

## 2020-04-10 NOTE — Patient Instructions (Signed)
Abdominal Breathing : 15 minutes, twice a day   Shoulders down - this is a cue to relax Place your hand on your abdomen - this helps you focus on easy abdominal breath support - the best and most relaxed way to breathe Breathe in through your nose and fill your belly with air, watching your hand move outward Breathe out through your mouth and watch your belly move in. An audible "sh"  may help   Think of your belly as a balloon, when you fill with air (inhale), the balloon gets bigger. As the air goes out (exhale), the balloon deflates.  If you are having difficulty coordinating this, lay on your back with a plastic cup on your belly and repeat the above steps, watching your belly move up with inhalation and down with exhalations  Practice breathing in and out in front of a mirror, watching your belly Breathe in for a count of 5 and breathe out for a count of 5  

## 2020-04-11 NOTE — Therapy (Signed)
[facility] MAIN [facility] SERVICES [address] [place], [place], [address] Phone: [phone]   Fax:  [phone]  Speech Language Pathology Evaluation  Patient Details  Name: Kathleen Lindsey MRN: [id number] Date of Birth: [birth year] Referring Provider (SLP): Kathleen Lindsey   Encounter Date: 04/10/2020   End of Session - 04/10/20 1443    Visit Number 1    Number of Visits 25    Date for SLP Re-Evaluation 07/09/20    Authorization Type BCBS    Authorization - Visit Number 1    Progress Note Due on Visit 10    SLP Start Time 1000    SLP Stop Time  1100    SLP Time Calculation (min) 60 min    Activity Tolerance Patient tolerated treatment well           Past Medical History:  Diagnosis Date  . Arthritis   . DVT (deep venous thrombosis) (HCC)     Past Surgical History:  Procedure Laterality Date  . PERIPHERAL VASCULAR THROMBECTOMY Right 06/15/2019   Procedure: PERIPHERAL VASCULAR THROMBECTOMY;  Surgeon: Annice Needy, MD;  Location: [facility] INVASIVE CV LAB;  Service: Cardiovascular;  Laterality: Right;    There were no vitals filed for this visit.   Subjective Assessment - 04/10/20 1007    Subjective "It sounds horrible."    Currently in Pain? Yes    Pain Score 6     Pain Location Throat    Pain Descriptors / Indicators Sore;Aching    Pain Type Chronic pain              SLP Evaluation [facility] - 04/10/20 1007      SLP Visit Information   SLP Received On 04/10/20    Referring Provider (SLP) Kathleen Lindsey    Onset Date Aug. 8, 2021    Medical Diagnosis vocal nodules, muscle tension dysphonia      Subjective   Patient/Family Stated Goal improve voice      General Information   HPI Patient is a 35 y.o. 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    Behavioral/Cognition alert, pleasant, cooperative    Mobility Status ambulated unassisted      Balance Screen   Has the patient fallen in the past 6 months No    Has the patient had a decrease in activity level because of a fear of falling?  No    Is the patient reluctant to leave their home because of a fear of falling?  No      Prior Functional Status   Cognitive/Linguistic Baseline Within functional limits      Cognition   Overall Cognitive Status Within Functional Limits for tasks assessed      Auditory Comprehension   Overall Auditory Comprehension Appears within functional limits for tasks assessed      Visual Recognition/Discrimination   Discrimination Not tested      Reading Comprehension   Reading Status Within funtional limits      Expression   Primary Mode of Expression Verbal      Verbal Expression   Overall Verbal Expression Appears within functional limits for tasks assessed  Written Expression   Dominant Hand Right      Oral Motor/Sensory Function   Overall Oral Motor/Sensory Function Appears within functional limits for tasks assessed      Motor Speech   Overall Motor Speech Impaired    Respiration Impaired    Level of Impairment Sentence    Phonation Hoarse;Low vocal intensity    Resonance Within functional limits    Articulation Within functional limitis    Intelligibility Intelligible   in a quiet environment   Motor Planning Witnin functional limits    Phonation Impaired    Vocal Abuses Glottal Attack;Vocal Fold Dehydration;Habitual Cough/Throat Clear    Tension Present Neck    Volume Soft    Pitch Low   fluctuates     Standardized Assessments   Standardized Assessments  Other Assessment VHI, Perceptual voice evaluation (see below)        Perceptual  Voice Evaluation    Voice Case History     Health risks: moderate ; caffeine use approx. 24 oz daily, daily H20 intake average  8-16 oz, pt is a nonsmoker, has LPR and seasonal allergies    Characteristic voice use: Since voice problem began, rates self as 3/10 with 10 being most talkative and 1 being least talkative. Prior to onset rates self as 7/10.   Environmental risks: stress (has child with developmental disabilities)   Misuse: Strain, glottal attack, tension   Phonotraumatic behaviors: occasional throat clearing   Vocal characteristics: Onset of voice problem pt reports was sudden after severe "reflux episode" in August 2021. Her problem gradually worsened over time. Vocal quality is raspy and strained; she has pain after speaking too much. Reduced vocal intensity and ability to project; reports vocal fatigue.  Objective Voice Measurements   Maximum phonation time for sustained "ah": 6.0 seconds   Average fundamental frequency during sustained "ah":   193.5 Hz (1.9 SD below average of  244 Hz +/-27 for gender)    Habitual pitch: 210 Hz    Highest dynamic pitch in conversational speech: 333 Hz   Lowest dynamic pitch in conversational speech: 101 Hz   Average time patient was able to sustain /s/: 15.1 sec   Average time patient was able to sustain /z/: 7.7 sec   s/z ratio:  (suggestive of dysfunction > 1.0) 1.95   VHI Score: The Voice Handicap Index is comprised of a series of questions to assess the patient's perception of their voice. It is designed to evaluate the emotional, physical and functional components of the voice problem.  Functional: 24 (severe) Physical: 26 (severe) Emotional: 28 (severe) Total:  (Normal mean 8.75, SD =14.97) z score =  (4.63) no significant impact = 0-1.00, mild = 1.01-1.99, moderate = 2.00-2.99, severe = 3.00+          SLP Short Term Goals - 04/11/20 0844      SLP SHORT TERM GOAL #1   Title Pt will demo HEP for breath  support, MTD accurately with rare min cues.    Time 10    Period --   sessions   Status New      SLP SHORT TERM GOAL #2   Title Patient will use abdominal breathing >90% accuracy in structured sentence level tasks.    Time 10    Period --   sessions   Status New      SLP SHORT TERM GOAL #3   Title Patient will ID tension/strain >90% accuracy in phrase level tasks.    Time 10  Period --   sessions   Status New      SLP SHORT TERM GOAL #4   Title Patient will maintain adequate vocal quality and endurance in 5 minute conversation >85% of the time using abdominal breathing and resonant voice techniques.    Time 10    Period Weeks    Status New            SLP Long Term Goals - 04/11/20 0845      SLP LONG TERM GOAL #1   Title Pt will report carryover of 4 vocal hygiene techniques between 3 consecutive sessions.    Time 12    Period Weeks    Status New      SLP LONG TERM GOAL #2   Title Patient will maintain adequate vocal quality/endurance in 20 minute conversation using abdominal breathing and/or resonant voice >85% of the time.    Time 12    Period Weeks    Status New      SLP LONG TERM GOAL #3   Title Patient will report improvement in voice outcome as measured by Voice Handicap Index.    Baseline z score 4.63 = severe    Time 12    Period Weeks    Status New            Plan - 04/11/20 7017    Clinical Impression Statement Patient presents with moderate dysphonia characterized by hoarse, raspy vocal quality, pitch breaks, strain, vocal fatigue, reduced volume and reduced ability to project. Nodules, polyp, and bilateral MTD dx on laryngoscopy. She also has LPR; recently switched from omeprazole to dexilant per ENT. She is trying to increase her water intake and reduce caffeine. She has 2 children, including one with special needs. She can feel her voice straining when she speaks for longer periods of time or over noise. Reports frustration and quality of life  impacts due to voice; avoids conversations, feels less outgoing. I recommend skilled ST to train pt in vocal hygiene, improve breath support for voice and reduce laryngeal tension in order to improve vocal quality, endurance, and quality of life.    Speech Therapy Frequency 2x / week    Duration 12 weeks   estimate 12-18 sessions total   Treatment/Interventions SLP instruction and feedback;Cueing hierarchy;Environmental controls;Functional tasks;Patient/family education;Compensatory strategies;Other (comment)   abdominal breathing, laryngeal relaxation, resonant voice therapy   Potential to Achieve Goals Good    Potential Considerations Other (comment)    SLP Home Exercise Plan see pt instructions    Consulted and Agree with Plan of Care Patient           Patient will benefit from skilled therapeutic intervention in order to improve the following deficits and impairments:   Dysphonia  Vocal cord nodule  Muscle tension dysphonia    Problem List Patient Active Problem List   Diagnosis Date Noted  . DVT (deep venous thrombosis) (HCC) 06/14/2019  . Pleuritic chest pain 05/02/2019  . Right knee pain 05/02/2019  . Encounter for long-term (current) use of high-risk medication 07/18/2016  . Seronegative rheumatoid arthritis (HCC) 07/18/2016  . Chronic inflammatory arthritis 06/16/2016   Rondel Baton, MS, CCC-SLP Speech-Language Pathologist  Arlana Lindau 04/11/2020, 10:26 AM  [facility] MAIN [facility] SERVICES [address] [place], [place], [address] Phone: [phone]   Fax:  [phone]  Name: Carollee Nussbaumer MRN: [id number] Date of Birth: [birth year]

## 2020-04-12 ENCOUNTER — Other Ambulatory Visit: Payer: Self-pay

## 2020-04-12 ENCOUNTER — Ambulatory Visit: Payer: BC Managed Care – PPO | Admitting: Speech Pathology

## 2020-04-12 DIAGNOSIS — R490 Dysphonia: Secondary | ICD-10-CM | POA: Diagnosis not present

## 2020-04-12 DIAGNOSIS — J382 Nodules of vocal cords: Secondary | ICD-10-CM

## 2020-04-12 NOTE — Patient Instructions (Addendum)
Your signs and symptoms may be consistent with esophageal dysphagia. Only your doctor can diagnose esophageal dysphagia, please discuss with your physician.  What is reflux? Gastroesophageal Reflux Disease (GERD) commonly referred to as reflux, is a backflow of acid from the stomach into the swallowing tube or esophagus.  Some reflux is normal, but when it happens frequently, the acid can irritate and damage the lining on the inside of the esophagus.  The most common symptom is heartburn.  Laryngopharyngeal Reflux (LPR) is when the acid backflow reaches the throat.  The structures of the throat (pharynx, larynx) are much more sensitive to stomach acid, so there is increased risk of damage.  People with LPR often do not experience heartburn.  The more common symptoms of LPR include: hoarseness, chronic cough, frequent throat clearing, feeling a lump in the throat and problems swallowing.  There are many changes you can make in diet, positioning and in your lifestyle that can have a dramatic effect in preventing or stopping reflux.  They include:  Everyday: . Avoid tight or constricting clothing . Avoid smoking, or exposing yourself to second hand smoke . Avoid non-steroidal anti-inflammatory drugs (ibuprofen, Alleve) . Exercise regularly, reduce stress . Lose weight   Avoiding or limiting certain foods: . Spicy, acidic (tomato-based foods, citrus or vinegar-based foods) . Fruit juices such as orange, grapefruit or cranberry . Fried foods . Caffeine (such as coffee, tea, sodas)   . Carbonated beverages  . Chocolate  . Peppermint . Alcohol . Decrease Dairy . Decrease red meat . Any food that gives you symptoms             During and after meals: . Eat slowly and don't overeat at meals . Eat several smaller meals a day, rather than larger ones . Do not lie down for at least  hour- 1 hour after meals . Avoid bending over or exercising after eating . Chewing gum (non-mint) for  20 minutes after each meal may be helpful . Drinking warm fluids with meals (i.e. warm decaffeinated tea) may help clear the esophagus better  Bedtime: . Avoid eating or drinking within 2-3 hours before bedtime, except for water . Elevate the head of the bed 6-8 inches with blocks, books, or wedge under your mattress (propping  yourself up on pillows may cause neck or back pain) . If you take medications at night, be sure to take them with a full glass of water   Practice your abdominal breathing, 15 minutes, twice a day. Afterwards, try gentle hum with your breath. If that's going well, try  "Maaaa" "Meeee" "Myyyyy" "Mo" "Moooooo" Then try the words and phrases with M and N (handout). If it at any point it sounds raspy, stop, take a breath and try again. If you're not getting a clear sound, go back to doing the last thing that successful.   Josh Danelle Earthly How's your day?  Irving Burton, come here I love you Alycia Rossetti, come take your medicine Get dressed Go get your toothbrush and toothpaste You're a sweet boy Have you fed Cindee Lame?

## 2020-04-12 NOTE — Therapy (Signed)
[facility] MAIN [facility] SERVICES [address] [place], [place], [address] Phone: [phone]   Fax:  [phone]  Speech Language Pathology Treatment  Patient Details  Name: Kathleen Lindsey MRN: [id number] Date of Birth: Jul 18, 1985 Referring Provider (SLP): Dr. Andee Poles   Encounter Date: 04/12/2020   End of Session - 04/12/20 1101    Visit Number 2    Number of Visits 25    Date for SLP Re-Evaluation 07/09/20    Authorization Type BCBS    Authorization - Visit Number 2    Progress Note Due on Visit 10    SLP Start Time [phone]    SLP Stop Time  1050    SLP Time Calculation (min) 60 min    Activity Tolerance Patient tolerated treatment well           Past Medical History:  Diagnosis Date  . Arthritis   . DVT (deep venous thrombosis) (HCC)     Past Surgical History:  Procedure Laterality Date  . PERIPHERAL VASCULAR THROMBECTOMY Right 06/15/2019   Procedure: PERIPHERAL VASCULAR THROMBECTOMY;  Surgeon: Annice Needy, MD;  Location: [facility] INVASIVE CV LAB;  Service: Cardiovascular;  Laterality: Right;    There were no vitals filed for this visit.   Subjective Assessment - 04/12/20 0953    Subjective "My throat's not as sore as the other day."    Currently in Pain? Yes    Pain Score 1     Pain Location Throat                 ADULT SLP TREATMENT - 04/12/20 0954      General Information   Behavior/Cognition Alert;Cooperative    HPI Patient is a 35 y.o. 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      Treatment Provided   Treatment provided Cognitive-Linquistic       Cognitive-Linquistic Treatment   Treatment focused on Voice;Patient/family/caregiver education    Skilled Treatment Reviewed GERD/LPR management and handout provided. Pt able to complete abdominal breathing at rest >95% accuracy with mod I. Progressed to hum and nasalized syllables with abdominal breath, 90% accuracy with resonant focus. With nasalized words and phrases, pt ID'd throaty focus with min-mod cues. Able to correct with demonstration ~75% accuracy. Some carryover with personally relevant phrases, provided in pt instructions for home practice with abdominal breathing. At sentence level accuracy is ~60%, with cues necessary to reduce glottal attack after second breath in longer utterances.      Assessment / Recommendations / Plan   Plan Continue with current plan of care      Progression Toward Goals   Progression toward goals Progressing toward goals            SLP Education - 04/12/20 1100    Education Details GERD/LPR, resonant voice handouts    Person(s) Educated Patient    Methods Explanation;Verbal cues;Handout    Comprehension Verbalized understanding;Need further instruction;Returned demonstration            SLP Short Term Goals - 04/11/20 0844      SLP SHORT TERM GOAL #1   Title Pt will demo HEP for breath support,  MTD accurately with rare min cues.    Time 10    Period --   sessions   Status New      SLP SHORT TERM GOAL #2   Title Patient will use abdominal breathing >90% accuracy in structured sentence level tasks.    Time 10    Period --   sessions   Status New      SLP SHORT TERM GOAL #3   Title Patient will ID tension/strain >90% accuracy in phrase level tasks.    Time 10    Period --   sessions   Status New      SLP SHORT TERM GOAL #4   Title Patient will maintain adequate vocal quality and endurance in 5 minute conversation >85% of the time using abdominal breathing and resonant voice techniques.    Time 10    Period Weeks    Status New             SLP Long Term Goals - 04/11/20 0845      SLP LONG TERM GOAL #1   Title Pt will report carryover of 4 vocal hygiene techniques between 3 consecutive sessions.    Time 12    Period Weeks    Status New      SLP LONG TERM GOAL #2   Title Patient will maintain adequate vocal quality/endurance in 20 minute conversation using abdominal breathing and/or resonant voice >85% of the time.    Time 12    Period Weeks    Status New      SLP LONG TERM GOAL #3   Title Patient will report improvement in voice outcome as measured by Voice Handicap Index.    Baseline z score 4.63 = severe    Time 12    Period Weeks    Status New            Plan - 04/12/20 1101    Clinical Impression Statement Patient presents with moderate dysphonia characterized by hoarse, raspy vocal quality, pitch breaks, strain, vocal fatigue, reduced volume and reduced ability to project. Nodule, polyp, and bilateral MTD dx on laryngoscopy. Abdominal breathing in isolation >95% accuracy with mod I; able to progress to nasalized words and phrases, with carryover of breathing/resonant voice to personally relevant phrases. I recommend skilled ST to train pt in vocal hygiene, improve breath support for voice and reduce laryngeal tension in order to improve vocal quality, endurance, and quality of life.    Speech Therapy Frequency 2x / week    Duration 12 weeks   estimate 12-18 sessions total   Treatment/Interventions SLP instruction and feedback;Cueing hierarchy;Environmental controls;Functional tasks;Patient/family education;Compensatory strategies;Other (comment)   abdominal breathing, laryngeal relaxation, resonant voice therapy   Potential to Achieve Goals Good    Potential Considerations Other (comment)    SLP Home Exercise Plan see pt instructions    Consulted and Agree with Plan of Care Patient           Patient will benefit from skilled therapeutic intervention in order to improve the following deficits  and impairments:   Dysphonia  Vocal cord nodule  Muscle tension dysphonia    Problem List Patient Active Problem List   Diagnosis Date Noted  . DVT (deep venous thrombosis) (HCC) 06/14/2019  . Pleuritic chest pain 05/02/2019  . Right knee pain 05/02/2019  . Encounter for long-term (current) use of high-risk medication 07/18/2016  . Seronegative rheumatoid arthritis (HCC) 07/18/2016  . Chronic inflammatory arthritis 06/16/2016   Shon Hale  Skeet Latch, MS, CCC-SLP Speech-Language Pathologist  Arlana Lindau 04/12/2020, 11:03 AM  [facility] MAIN [facility] SERVICES [address] [place], [place], [address] Phone: [phone]   Fax:  [phone]   Name: Ikea Demicco MRN: [id number] Date of Birth: October 17, 1985

## 2020-04-16 ENCOUNTER — Other Ambulatory Visit: Payer: Self-pay

## 2020-04-16 ENCOUNTER — Ambulatory Visit: Payer: BC Managed Care – PPO | Admitting: Speech Pathology

## 2020-04-16 DIAGNOSIS — J382 Nodules of vocal cords: Secondary | ICD-10-CM

## 2020-04-16 DIAGNOSIS — R490 Dysphonia: Secondary | ICD-10-CM | POA: Diagnosis not present

## 2020-04-16 NOTE — Patient Instructions (Signed)
If you're having a hard time getting clear oral resonance, try some of these:  "Hmmmmm" through a drinking straw (use a good abdominal breath) Pitch glides up and down through the straw (breath up, breath down) for 2 minutes  Lip trills: -Blow a raspberry with your lips -Add your voice so it sounds like a motor boat. It should still feel smooth.   Then try to go back to your hum, mmmma, mmmmme, mmmmoo, etc.  If still struggling, rest your voice for a while, come back to it later.     Vocal hygiene -Try to rest your voice for as long as you use it. -Try some non-verbals with your kids. Can you come up with a few signals to help communicate not requiring your voice, use a dry erase board, text to speech.  -Great job Radiographer, therapeutic with you. Keep sipping it throughout the day. -Remember to keep the voice soft, easy, and flowing with your breath. (use your "calm voice" all the time).

## 2020-04-16 NOTE — Therapy (Signed)
[place] [facility] MAIN [facility] SERVICES [address] [place], [place], [address] Phone: [phone]   Fax:  [phone]  Speech Language Pathology Treatment  Patient Details  Name: Kathleen Lindsey MRN: [id number] Date of Birth: 05/27/1985 Referring Provider (SLP): Dr. Andee Poles   Encounter Date: 04/16/2020   End of Session - 04/16/20 1649    Visit Number 3    Number of Visits 25    Date for SLP Re-Evaluation 07/09/20    Authorization Type BCBS    Authorization - Visit Number 3    Progress Note Due on Visit 10    SLP Start Time 1000    SLP Stop Time  1100    SLP Time Calculation (min) 60 min    Activity Tolerance Patient tolerated treatment well           Past Medical History:  Diagnosis Date  . Arthritis   . DVT (deep venous thrombosis) (HCC)     Past Surgical History:  Procedure Laterality Date  . PERIPHERAL VASCULAR THROMBECTOMY Right 06/15/2019   Procedure: PERIPHERAL VASCULAR THROMBECTOMY;  Surgeon: Annice Needy, MD;  Location: [facility] INVASIVE CV LAB;  Service: Cardiovascular;  Laterality: Right;    There were no vitals filed for this visit.   Subjective Assessment - 04/16/20 1644    Subjective Talked more over the weekend    Currently in Pain? Yes    Pain Score 4                  ADULT SLP TREATMENT - 04/16/20 1644      General Information   Behavior/Cognition Alert;Cooperative    HPI Patient is a 35 y.o. 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      Treatment Provided   Treatment provided Cognitive-Linquistic      Pain Assessment   Pain  Assessment No/denies pain      Cognitive-Linquistic Treatment   Treatment focused on Voice;Patient/family/caregiver education    Skilled Treatment Pt had more vocal demands over the weekend with kids home from school. Discussed vocal hygiene and resting voice for as long as she uses it; pt felt she would be able to do this. 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 Completed stretches and abdominal breathing >95% accuracy modified independence. Attempted hum and sustained phonation with intermittent success; this was frustrating for pt. Used lip trills, straw phonation to promote forward resonant focus; pt required mod cues for this. By end of session pt able to read nasalized words, phrases 90% accuracy with AB/resonant focus; ID'd suboptimal vocal quality/strain 80% of the time.      Assessment / Recommendations / Plan   Plan Continue with current plan of care      Progression Toward Goals   Progression toward goals Progressing toward goals            SLP Education - 04/16/20 1649    Education Details oral resonance exercises    Person(s) Educated Patient    Methods Explanation    Comprehension Verbalized understanding  SLP Short Term Goals - 04/11/20 0844      SLP SHORT TERM GOAL #1   Title Pt will demo HEP for breath support, MTD accurately with rare min cues.    Time 10    Period --   sessions   Status New      SLP SHORT TERM GOAL #2   Title Patient will use abdominal breathing >90% accuracy in structured sentence level tasks.    Time 10    Period --   sessions   Status New      SLP SHORT TERM GOAL #3   Title Patient will ID tension/strain >90% accuracy in phrase level tasks.    Time 10    Period --   sessions   Status New      SLP SHORT TERM GOAL #4   Title Patient will maintain adequate vocal quality and endurance in 5 minute conversation >85% of the time using abdominal  breathing and resonant voice techniques.    Time 10    Period Weeks    Status New            SLP Long Term Goals - 04/11/20 0845      SLP LONG TERM GOAL #1   Title Pt will report carryover of 4 vocal hygiene techniques between 3 consecutive sessions.    Time 12    Period Weeks    Status New      SLP LONG TERM GOAL #2   Title Patient will maintain adequate vocal quality/endurance in 20 minute conversation using abdominal breathing and/or resonant voice >85% of the time.    Time 12    Period Weeks    Status New      SLP LONG TERM GOAL #3   Title Patient will report improvement in voice outcome as measured by Voice Handicap Index.    Baseline z score 4.63 = severe    Time 12    Period Weeks    Status New            Plan - 04/16/20 1650    Clinical Impression Statement Patient progressing with ability to use abdominal breath and oral resonant focus in word and phrase level tasks. Improving awareness of vocal quality and able to correct with mod cues today. I recommend skilled ST to train pt in vocal hygiene, improve breath support for voice and reduce laryngeal tension in order to improve vocal quality, endurance, and quality of life.    Speech Therapy Frequency 2x / week    Duration 12 weeks   estimate 12-18 sessions total   Treatment/Interventions SLP instruction and feedback;Cueing hierarchy;Environmental controls;Functional tasks;Patient/family education;Compensatory strategies;Other (comment)   abdominal breathing, laryngeal relaxation, resonant voice therapy   Potential to Achieve Goals Good    Potential Considerations Other (comment)    SLP Home Exercise Plan see pt instructions    Consulted and Agree with Plan of Care Patient           Patient will benefit from skilled therapeutic intervention in order to improve the following deficits and impairments:   Dysphonia  Vocal cord nodule  Muscle tension dysphonia    Problem List Patient Active Problem List    Diagnosis Date Noted  . DVT (deep venous thrombosis) (HCC) 06/14/2019  . Pleuritic chest pain 05/02/2019  . Right knee pain 05/02/2019  . Encounter for long-term (current) use of high-risk medication 07/18/2016  . Seronegative rheumatoid arthritis (HCC) 07/18/2016  . Chronic inflammatory arthritis 06/16/2016   [facility]  Grace Blight, MS, CCC-SLP Speech-Language Pathologist  Arlana Lindau 04/16/2020, 4:53 PM  [place] [facility] MAIN [facility] SERVICES [address] [place], [place], [address] Phone: [phone]   Fax:  [phone]   Name: Kathleen Lindsey MRN: [id number] Date of Birth: [birth year]

## 2020-04-19 ENCOUNTER — Ambulatory Visit: Payer: BC Managed Care – PPO | Admitting: Speech Pathology

## 2020-04-19 ENCOUNTER — Other Ambulatory Visit: Payer: Self-pay

## 2020-04-19 DIAGNOSIS — J382 Nodules of vocal cords: Secondary | ICD-10-CM | POA: Diagnosis not present

## 2020-04-19 DIAGNOSIS — R490 Dysphonia: Secondary | ICD-10-CM | POA: Diagnosis not present

## 2020-04-19 NOTE — Therapy (Signed)
[place] [facility] MAIN [facility] SERVICES [address] [place], [place], [address] Phone: [phone]   Fax:  [phone]  Speech Language Pathology Treatment  Patient Details  Name: Kathleen Lindsey MRN: [id number] Date of Birth: 30-Dec-1985 Referring Provider (SLP): Dr. Andee Poles   Encounter Date: 04/19/2020   End of Session - 04/19/20 1102    Visit Number 4    Number of Visits 25    Date for SLP Re-Evaluation 07/09/20    Authorization Type BCBS    Authorization - Visit Number 4    Progress Note Due on Visit 10    SLP Start Time 0900    SLP Stop Time  1000    SLP Time Calculation (min) 60 min    Activity Tolerance Patient tolerated treatment well           Past Medical History:  Diagnosis Date  . Arthritis   . DVT (deep venous thrombosis) (HCC)     Past Surgical History:  Procedure Laterality Date  . PERIPHERAL VASCULAR THROMBECTOMY Right 06/15/2019   Procedure: PERIPHERAL VASCULAR THROMBECTOMY;  Surgeon: Annice Needy, MD;  Location: [facility] INVASIVE CV LAB;  Service: Cardiovascular;  Laterality: Right;    There were no vitals filed for this visit.   Subjective Assessment - 04/19/20 0903    Subjective "I don't know how well I'll do today."    Currently in Pain? Yes    Pain Score 3     Pain Location Throat                 ADULT SLP TREATMENT - 04/19/20 0904      General Information   Behavior/Cognition Alert;Cooperative    HPI Patient is a 35 y.o. 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      Treatment Provided   Treatment provided Cognitive-Linquistic       Cognitive-Linquistic Treatment   Treatment focused on Voice;Patient/family/caregiver education    Skilled Treatment Son was home from school yesterday and she did not have much opportunity to rest her voice. Has been trying to have her daughter assist/repeat things she says if needed. Pt completed stretches, AB at rest with modified independence. Initially unable to attain good vocal quality with hum; used straw for oral resonance and then able to progress to hum (ID'd/corrected throaty focus 90% of the time), and then hum to vowel, hum to single words (80% accuracy, 100% with self-correction), and words/phrases with cognitive load (80%, 100% with self-correction).      Assessment / Recommendations / Plan   Plan Continue with current plan of care      Progression Toward Goals   Progression toward goals Progressing toward goals            SLP Education - 04/19/20 1101    Education Details confidential voice    Person(s) Educated Patient    Methods Explanation;Demonstration;Verbal cues    Comprehension Verbalized understanding;Returned demonstration;Need further instruction            SLP Short Term Goals - 04/11/20 0844      SLP SHORT TERM GOAL #1   Title Pt will demo HEP for breath support,  MTD accurately with rare min cues.    Time 10    Period --   sessions   Status New      SLP SHORT TERM GOAL #2   Title Patient will use abdominal breathing >90% accuracy in structured sentence level tasks.    Time 10    Period --   sessions   Status New      SLP SHORT TERM GOAL #3   Title Patient will ID tension/strain >90% accuracy in phrase level tasks.    Time 10    Period --   sessions   Status New      SLP SHORT TERM GOAL #4   Title Patient will maintain adequate vocal quality and endurance in 5 minute conversation >85% of the time using abdominal breathing and resonant voice techniques.    Time 10    Period Weeks    Status New            SLP Long Term Goals - 04/11/20  0845      SLP LONG TERM GOAL #1   Title Pt will report carryover of 4 vocal hygiene techniques between 3 consecutive sessions.    Time 12    Period Weeks    Status New      SLP LONG TERM GOAL #2   Title Patient will maintain adequate vocal quality/endurance in 20 minute conversation using abdominal breathing and/or resonant voice >85% of the time.    Time 12    Period Weeks    Status New      SLP LONG TERM GOAL #3   Title Patient will report improvement in voice outcome as measured by Voice Handicap Index.    Baseline z score 4.63 = severe    Time 12    Period Weeks    Status New            Plan - 04/19/20 1102    Clinical Impression Statement Patient having difficulty finding opportunities for voice rest due to demands of having son with autism at home who requires significant verbal redirection. Awareness of vocal quality continues to improve and she demonstrated ability to use hum fade into word and phrase level tasks with ability to recognize errors and attempt correction. I recommend skilled ST to train pt in vocal hygiene, improve breath support for voice and reduce laryngeal tension in order to improve vocal quality, endurance, and quality of life.    Speech Therapy Frequency 2x / week    Duration 12 weeks   estimate 12-18 sessions total   Treatment/Interventions SLP instruction and feedback;Cueing hierarchy;Environmental controls;Functional tasks;Patient/family education;Compensatory strategies;Other (comment)   abdominal breathing, laryngeal relaxation, resonant voice therapy   Potential to Achieve Goals Good    Potential Considerations Other (comment)    SLP Home Exercise Plan see pt instructions    Consulted and Agree with Plan of Care Patient           Patient will benefit from skilled therapeutic intervention in order to improve the following deficits and impairments:   Dysphonia  Vocal cord nodule  Muscle tension dysphonia    Problem List Patient Active  Problem List   Diagnosis Date Noted  . DVT (deep venous thrombosis) (HCC) 06/14/2019  . Pleuritic chest pain 05/02/2019  . Right knee pain 05/02/2019  . Encounter for long-term (current) use of high-risk medication 07/18/2016  . Seronegative rheumatoid arthritis (HCC) 07/18/2016  . Chronic inflammatory arthritis 06/16/2016   Rondel Baton, MS, CCC-SLP Speech-Language Pathologist  Arlana Lindau 04/19/2020, 11:05 AM  [place] [facility] MAIN [facility] SERVICES [address] [place], [place], [address] Phone: [phone]   Fax:  [phone]   Name: Kathleen Lindsey MRN: [id number] Date of Birth: 06-22-1985

## 2020-04-23 ENCOUNTER — Ambulatory Visit: Payer: BC Managed Care – PPO | Admitting: Speech Pathology

## 2020-04-26 ENCOUNTER — Ambulatory Visit: Payer: BC Managed Care – PPO | Admitting: Speech Pathology

## 2020-04-26 ENCOUNTER — Other Ambulatory Visit: Payer: Self-pay

## 2020-04-26 DIAGNOSIS — J382 Nodules of vocal cords: Secondary | ICD-10-CM

## 2020-04-26 DIAGNOSIS — R490 Dysphonia: Secondary | ICD-10-CM | POA: Diagnosis not present

## 2020-04-26 NOTE — Therapy (Signed)
[place] [facility] MAIN [facility] SERVICES [address] [place], [place], [address] Phone: [phone]   Fax:  [phone]  Speech Language Pathology Treatment  Patient Details  Name: Kathleen Lindsey MRN: [id number] Date of Birth: 1985-08-18 Referring Provider (SLP): Dr. Andee Poles   Encounter Date: 04/26/2020   End of Session - 04/26/20 0955    Visit Number 5    Number of Visits 25    Date for SLP Re-Evaluation 07/09/20    Authorization Type BCBS    Authorization - Visit Number 5    Progress Note Due on Visit 10    SLP Start Time 0900    SLP Stop Time  0950    SLP Time Calculation (min) 50 min    Activity Tolerance Patient tolerated treatment well           Past Medical History:  Diagnosis Date  . Arthritis   . DVT (deep venous thrombosis) (HCC)     Past Surgical History:  Procedure Laterality Date  . PERIPHERAL VASCULAR THROMBECTOMY Right 06/15/2019   Procedure: PERIPHERAL VASCULAR THROMBECTOMY;  Surgeon: Annice Needy, MD;  Location: [facility] INVASIVE CV LAB;  Service: Cardiovascular;  Laterality: Right;    There were no vitals filed for this visit.   Subjective Assessment - 04/26/20 0903    Subjective "Last Friday I had to talk to my son over music."    Patient is accompained by: Family member   spouse   Currently in Pain? Yes    Pain Score 1     Pain Location Throat                 ADULT SLP TREATMENT - 04/26/20 0904      General Information   Behavior/Cognition Alert;Cooperative    HPI Patient is a 35 y.o. 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       Treatment Provided   Treatment provided Cognitive-Linquistic      Cognitive-Linquistic Treatment   Treatment focused on Voice;Patient/family/caregiver education    Skilled Treatment Patient entered treatment room, diplophonia, rough vocal quality noted. Reported short of breath after walking in from parking lot. Completed abdominal breathing (AB) at rest with mod I for 3 minutes >95% accuracy. Hum with with AB, progressing to nasalized syllables, words, and sentences 90%. ID'd suboptimal breath/vocal quality 2/2 occasions and self-corrected. Pt maintained AB and used confidential voice in paragraph reading 70% accuracy, aware of tension/strain 90% of the time and corrected with occasional min cues. Brief carryover to conversation for 2-3 minutes, 70% accuracy.      Assessment / Recommendations / Plan   Plan Continue with current plan of care      Progression Toward Goals   Progression toward goals Progressing toward goals            SLP Education - 04/26/20 0955    Education Details phototraumatic behaviors    Person(s) Educated Patient;Spouse    Methods Explanation    Comprehension Verbalized understanding            SLP Short Term Goals - 04/11/20 0844      SLP SHORT TERM GOAL #1   Title  Pt will demo HEP for breath support, MTD accurately with rare min cues.    Time 10    Period --   sessions   Status New      SLP SHORT TERM GOAL #2   Title Patient will use abdominal breathing >90% accuracy in structured sentence level tasks.    Time 10    Period --   sessions   Status New      SLP SHORT TERM GOAL #3   Title Patient will ID tension/strain >90% accuracy in phrase level tasks.    Time 10    Period --   sessions   Status New      SLP SHORT TERM GOAL #4   Title Patient will maintain adequate vocal quality and endurance in 5 minute conversation >85% of the time using abdominal breathing and resonant voice techniques.    Time 10    Period Weeks    Status New             SLP Long Term Goals - 04/11/20 0845      SLP LONG TERM GOAL #1   Title Pt will report carryover of 4 vocal hygiene techniques between 3 consecutive sessions.    Time 12    Period Weeks    Status New      SLP LONG TERM GOAL #2   Title Patient will maintain adequate vocal quality/endurance in 20 minute conversation using abdominal breathing and/or resonant voice >85% of the time.    Time 12    Period Weeks    Status New      SLP LONG TERM GOAL #3   Title Patient will report improvement in voice outcome as measured by Voice Handicap Index.    Baseline z score 4.63 = severe    Time 12    Period Weeks    Status New            Plan - 04/26/20 0956    Clinical Impression Statement Worked with pt today to ID some strategies to use more nonverbals with her son to reduce vocal strain. Awareness of vocal quality continues to improve and she demonstrated ability to use abdominal breathing, confidential voice and resonant techniques at paragraph level 70% accuracy today, with some carryover to short conversation. I recommend skilled ST to train pt in vocal hygiene, improve breath support for voice and reduce laryngeal tension in order to improve vocal quality, endurance, and quality of life.    Speech Therapy Frequency 2x / week    Duration 12 weeks   estimate 12-18 sessions total   Treatment/Interventions SLP instruction and feedback;Cueing hierarchy;Environmental controls;Functional tasks;Patient/family education;Compensatory strategies;Other (comment)   abdominal breathing, laryngeal relaxation, resonant voice therapy   Potential to Achieve Goals Good    Potential Considerations Other (comment)    SLP Home Exercise Plan see pt instructions    Consulted and Agree with Plan of Care Patient           Patient will benefit from skilled therapeutic intervention in order to improve the following deficits and impairments:   Dysphonia  Vocal cord nodule  Muscle tension  dysphonia    Problem List Patient Active Problem List   Diagnosis Date Noted  . DVT (deep venous thrombosis) (HCC) 06/14/2019  . Pleuritic chest pain 05/02/2019  . Right knee pain 05/02/2019  . Encounter for long-term (current) use of high-risk medication 07/18/2016  . Seronegative rheumatoid arthritis (HCC) 07/18/2016  . Chronic inflammatory arthritis 06/16/2016   [facility]  Kathleen Blight, MS, CCC-SLP Speech-Language Pathologist  Arlana Lindau 04/26/2020, 9:57 AM  [place] [facility] MAIN [facility] SERVICES [address] [place], [place], [address] Phone: [phone]   Fax:  [phone]   Name: Kathleen Lindsey MRN: [id number] Date of Birth: April 30, 1985

## 2020-04-30 ENCOUNTER — Ambulatory Visit: Payer: BC Managed Care – PPO | Admitting: Speech Pathology

## 2020-05-02 DIAGNOSIS — R5382 Chronic fatigue, unspecified: Secondary | ICD-10-CM | POA: Diagnosis not present

## 2020-05-02 DIAGNOSIS — Z79899 Other long term (current) drug therapy: Secondary | ICD-10-CM | POA: Diagnosis not present

## 2020-05-02 DIAGNOSIS — M0600 Rheumatoid arthritis without rheumatoid factor, unspecified site: Secondary | ICD-10-CM | POA: Diagnosis not present

## 2020-05-03 ENCOUNTER — Other Ambulatory Visit: Payer: Self-pay

## 2020-05-03 ENCOUNTER — Ambulatory Visit: Payer: BC Managed Care – PPO | Attending: Otolaryngology | Admitting: Speech Pathology

## 2020-05-03 DIAGNOSIS — R490 Dysphonia: Secondary | ICD-10-CM | POA: Diagnosis not present

## 2020-05-03 DIAGNOSIS — J382 Nodules of vocal cords: Secondary | ICD-10-CM | POA: Diagnosis not present

## 2020-05-03 NOTE — Therapy (Signed)
[facility] MAIN [facility] SERVICES [address] [place], [place], [address] Phone: [phone]   Fax:  [phone]  Speech Language Pathology Treatment  Patient Details  Name: Kathleen [place] MRN: [id number] Date of Birth: Nov 03, 1985 Referring Provider (SLP): Dr. Andee Poles   Encounter Date: 05/03/2020   End of Session - 05/03/20 1055    Visit Number 6    Number of Visits 25    Date for SLP Re-Evaluation 07/09/20    Authorization Type BCBS    Authorization - Visit Number 6    Progress Note Due on Visit 10    SLP Start Time [id number]    SLP Stop Time  1050    SLP Time Calculation (min) 55 min    Activity Tolerance Patient tolerated treatment well           Past Medical History:  Diagnosis Date  . Arthritis   . DVT (deep venous thrombosis) (HCC)     Past Surgical History:  Procedure Laterality Date  . PERIPHERAL VASCULAR THROMBECTOMY Right 06/15/2019   Procedure: PERIPHERAL VASCULAR THROMBECTOMY;  Surgeon: Annice Needy, MD;  Location: [facility] INVASIVE CV LAB;  Service: Cardiovascular;  Laterality: Right;    There were no vitals filed for this visit.   Subjective Assessment - 05/03/20 0958    Subjective "Let me wait until I'm not walking" (so she can catch breath)    Currently in Pain? Yes    Pain Score 5     Pain Location Throat                 ADULT SLP TREATMENT - 05/03/20 1054      General Information   Behavior/Cognition Alert;Cooperative    HPI Patient is a 35 y.o. 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      Treatment Provided   Treatment provided  Cognitive-Linquistic      Cognitive-Linquistic Treatment   Treatment focused on Voice;Patient/family/caregiver education    Skilled Treatment Pt reported she sneezed and "felt something pop" in her throat. She had some pain. She felt a lump and pressed it in, and had no more pain after that. She is trying to be more mindful of vocal hygiene, has tried to reduce talking to spouse and kids from another room. Her son has been home from school this week and this has been a challenge for her to find opportunities to rest her voice. Pt completed hum, nasalized vowels with abdominal breathing and good vocal quality 100% accuracy, moved to automatic speech tasks, self-correcting 100% of the time. Noted attempts to use confidential voice in conversation between tasks. Paragraph reading using abdominal breathing, oral resonance 75% accuracy; pt self-monitored and corrected suboptimal vocal quality or strain 90% of the time.      Assessment / Recommendations / Plan   Plan Continue with current plan of care      Progression Toward Goals   Progression toward goals Progressing toward goals            SLP Education - 05/03/20 1055    Education Details confidential voice    Person(s) Educated Patient    Methods Explanation  Comprehension Verbalized understanding            SLP Short Term Goals - 04/11/20 0844      SLP SHORT TERM GOAL #1   Title Pt will demo HEP for breath support, MTD accurately with rare min cues.    Time 10    Period --   sessions   Status New      SLP SHORT TERM GOAL #2   Title Patient will use abdominal breathing >90% accuracy in structured sentence level tasks.    Time 10    Period --   sessions   Status New      SLP SHORT TERM GOAL #3   Title Patient will ID tension/strain >90% accuracy in phrase level tasks.    Time 10    Period --   sessions   Status New      SLP SHORT TERM GOAL #4   Title Patient will maintain adequate vocal quality and endurance in 5 minute  conversation >85% of the time using abdominal breathing and resonant voice techniques.    Time 10    Period Weeks    Status New            SLP Long Term Goals - 04/11/20 0845      SLP LONG TERM GOAL #1   Title Pt will report carryover of 4 vocal hygiene techniques between 3 consecutive sessions.    Time 12    Period Weeks    Status New      SLP LONG TERM GOAL #2   Title Patient will maintain adequate vocal quality/endurance in 20 minute conversation using abdominal breathing and/or resonant voice >85% of the time.    Time 12    Period Weeks    Status New      SLP LONG TERM GOAL #3   Title Patient will report improvement in voice outcome as measured by Voice Handicap Index.    Baseline z score 4.63 = severe    Time 12    Period Weeks    Status New            Plan - 05/03/20 1055    Clinical Impression Statement Patient finding it challenging to use confidential voice and take the time to focus on her breathing at home given demands, however she demonstrates good awareness of strained vocal quality and speaking on residual capacity. Demonstrating ability to correct this 90% of the time in paragraph reading today. Skilled ST remains necessary to improve carryover and ability to maintain vocal quality to meet vocal demands at home.  I recommend skilled ST to train pt in vocal hygiene, improve breath support for voice and reduce laryngeal tension in order to improve vocal quality, endurance, and quality of life.    Speech Therapy Frequency 2x / week    Duration 12 weeks   estimate 12-18 sessions total   Treatment/Interventions SLP instruction and feedback;Cueing hierarchy;Environmental controls;Functional tasks;Patient/family education;Compensatory strategies;Other (comment)   abdominal breathing, laryngeal relaxation, resonant voice therapy   Potential to Achieve Goals Good    Potential Considerations Other (comment)    SLP Home Exercise Plan see pt instructions    Consulted and  Agree with Plan of Care Patient           Patient will benefit from skilled therapeutic intervention in order to improve the following deficits and impairments:   Dysphonia  Vocal cord nodule  Muscle tension dysphonia    Problem List Patient Active Problem List  Diagnosis Date Noted  . DVT (deep venous thrombosis) (HCC) 06/14/2019  . Pleuritic chest pain 05/02/2019  . Right knee pain 05/02/2019  . Encounter for long-term (current) use of high-risk medication 07/18/2016  . Seronegative rheumatoid arthritis (HCC) 07/18/2016  . Chronic inflammatory arthritis 06/16/2016   Rondel Baton, MS, CCC-SLP Speech-Language Pathologist  Arlana Lindau 05/03/2020, 10:58 AM  [facility] MAIN [facility] SERVICES [address] [place], [place], [address] Phone: [phone]   Fax:  [phone]   Name: Pahola Dimmitt MRN: [id number] Date of Birth: 1985/12/09

## 2020-05-08 ENCOUNTER — Ambulatory Visit: Payer: BC Managed Care – PPO | Admitting: Speech Pathology

## 2020-05-10 ENCOUNTER — Ambulatory Visit: Payer: BC Managed Care – PPO | Admitting: Speech Pathology

## 2020-05-15 ENCOUNTER — Ambulatory Visit: Payer: BC Managed Care – PPO | Admitting: Speech Pathology

## 2020-05-17 ENCOUNTER — Ambulatory Visit: Payer: BC Managed Care – PPO | Admitting: Speech Pathology

## 2020-05-17 ENCOUNTER — Other Ambulatory Visit: Payer: Self-pay

## 2020-05-17 DIAGNOSIS — J382 Nodules of vocal cords: Secondary | ICD-10-CM

## 2020-05-17 DIAGNOSIS — R490 Dysphonia: Secondary | ICD-10-CM | POA: Diagnosis not present

## 2020-05-17 NOTE — Therapy (Signed)
[place] MAIN [facility] SERVICES [address] [place], [place], [address] Phone: [phone]   Fax:  [phone]  Speech Language Pathology Treatment  Patient Details  Name: Kathleen [place] MRN: [id number] Date of Birth: 24-Apr-1985 Referring Provider (SLP): Dr. Andee Poles   Encounter Date: 05/17/2020   End of Session - 05/17/20 1057    Visit Number 7    Number of Visits 25    Date for SLP Re-Evaluation 07/09/20    Authorization Type BCBS    Authorization - Visit Number 7    Progress Note Due on Visit 10    SLP Start Time 1000    SLP Stop Time  1050    SLP Time Calculation (min) 50 min    Activity Tolerance Patient tolerated treatment well           Past Medical History:  Diagnosis Date  . Arthritis   . DVT (deep venous thrombosis) (HCC)     Past Surgical History:  Procedure Laterality Date  . PERIPHERAL VASCULAR THROMBECTOMY Right 06/15/2019   Procedure: PERIPHERAL VASCULAR THROMBECTOMY;  Surgeon: Annice Needy, MD;  Location: [facility] INVASIVE CV LAB;  Service: Cardiovascular;  Laterality: Right;    There were no vitals filed for this visit.   Subjective Assessment - 05/17/20 1056    Subjective "I don't know how I'll do today."    Currently in Pain? Yes    Pain Score 1     Pain Location Throat                 ADULT SLP TREATMENT - 05/17/20 1056      General Information   Behavior/Cognition Alert;Cooperative;Pleasant mood    HPI Patient is a 35 y.o. 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      Treatment Provided   Treatment provided  Cognitive-Linquistic      Cognitive-Linquistic Treatment   Treatment focused on Voice;Patient/family/caregiver education    Skilled Treatment Patient reported 4-5 reflux episodes this month, has been worse over the last week. Was elevating head of bed at night but stopped this as her spouse has difficulty sleeping this way. Recommended pt look into purchasing wedge pillow to elevate while sleeping. Excessive vocal demands over the past week with kids being out on spring break. She struggles to find opportunities to rest her voice. Pt completed relaxation stretches and abdominal breathing, resonant hum with modified independence. Used confidential voice and abdominal breathing in multiparagraph reading with occasional mod cues to reduce tension. Has follow-up with Dr. Andee Poles tomorrow. No longer taking prednisone. Encouraged her to discuss reflux episodes/medications with Dr. Andee Poles. Also encouraged food logging when she has reflux to identify possible triggers.      Assessment / Recommendations / Plan   Plan Continue with current plan of care      Progression Toward Goals   Progression toward goals Progressing toward goals            SLP Education - 05/17/20 1057    Education Details reflux precautions/ modifications    Person(s) Educated Patient    Methods Explanation    Comprehension Verbalized understanding  SLP Short Term Goals - 04/11/20 0844      SLP SHORT TERM GOAL #1   Title Pt will demo HEP for breath support, MTD accurately with rare min cues.    Time 10    Period --   sessions   Status New      SLP SHORT TERM GOAL #2   Title Patient will use abdominal breathing >90% accuracy in structured sentence level tasks.    Time 10    Period --   sessions   Status New      SLP SHORT TERM GOAL #3   Title Patient will ID tension/strain >90% accuracy in phrase level tasks.    Time 10    Period --   sessions   Status New      SLP SHORT TERM GOAL #4   Title Patient  will maintain adequate vocal quality and endurance in 5 minute conversation >85% of the time using abdominal breathing and resonant voice techniques.    Time 10    Period Weeks    Status New            SLP Long Term Goals - 04/11/20 0845      SLP LONG TERM GOAL #1   Title Pt will report carryover of 4 vocal hygiene techniques between 3 consecutive sessions.    Time 12    Period Weeks    Status New      SLP LONG TERM GOAL #2   Title Patient will maintain adequate vocal quality/endurance in 20 minute conversation using abdominal breathing and/or resonant voice >85% of the time.    Time 12    Period Weeks    Status New      SLP LONG TERM GOAL #3   Title Patient will report improvement in voice outcome as measured by Voice Handicap Index.    Baseline z score 4.63 = severe    Time 12    Period Weeks    Status New            Plan - 05/17/20 1057    Clinical Impression Statement Patient finding it challenging to use confidential voice and take the time to focus on her breathing at home given demands, however she demonstrates good awareness of strained vocal quality and speaking on residual capacity. Has been struggling with reflux more over the past couple of weeks. Skilled ST remains necessary to improve carryover and ability to maintain vocal quality to meet vocal demands at home.  I recommend skilled ST to train pt in vocal hygiene, improve breath support for voice and reduce laryngeal tension in order to improve vocal quality, endurance, and quality of life.    Speech Therapy Frequency 2x / week    Duration 12 weeks   estimate 12-18 sessions total   Treatment/Interventions SLP instruction and feedback;Cueing hierarchy;Environmental controls;Functional tasks;Patient/family education;Compensatory strategies;Other (comment)   abdominal breathing, laryngeal relaxation, resonant voice therapy   Potential to Achieve Goals Good    Potential Considerations Other (comment)    SLP Home  Exercise Plan see pt instructions    Consulted and Agree with Plan of Care Patient           Patient will benefit from skilled therapeutic intervention in order to improve the following deficits and impairments:   Dysphonia  Vocal cord nodule  Muscle tension dysphonia    Problem List Patient Active Problem List   Diagnosis Date Noted  . DVT (deep venous thrombosis) (HCC) 06/14/2019  . Pleuritic  chest pain 05/02/2019  . Right knee pain 05/02/2019  . Encounter for long-term (current) use of high-risk medication 07/18/2016  . Seronegative rheumatoid arthritis (HCC) 07/18/2016  . Chronic inflammatory arthritis 06/16/2016   Rondel Baton, MS, CCC-SLP Speech-Language Pathologist  Arlana Lindau 05/17/2020, 10:58 AM  [place] [facility] MAIN [facility] SERVICES [address] [place], [place], [address] Phone: [phone]   Fax:  [phone]   Name: Koreena Joost MRN: [id number] Date of Birth: Dec 05, 1985

## 2020-05-18 DIAGNOSIS — K219 Gastro-esophageal reflux disease without esophagitis: Secondary | ICD-10-CM | POA: Diagnosis not present

## 2020-05-18 DIAGNOSIS — J382 Nodules of vocal cords: Secondary | ICD-10-CM | POA: Diagnosis not present

## 2020-05-18 DIAGNOSIS — R490 Dysphonia: Secondary | ICD-10-CM | POA: Diagnosis not present

## 2020-05-22 ENCOUNTER — Ambulatory Visit: Payer: BC Managed Care – PPO | Admitting: Speech Pathology

## 2020-05-22 ENCOUNTER — Other Ambulatory Visit: Payer: Self-pay

## 2020-05-22 DIAGNOSIS — J382 Nodules of vocal cords: Secondary | ICD-10-CM | POA: Diagnosis not present

## 2020-05-22 DIAGNOSIS — R490 Dysphonia: Secondary | ICD-10-CM

## 2020-05-23 NOTE — Therapy (Signed)
[facility] [facility] MAIN [facility] SERVICES [address] [place], [place], [address] Phone: [phone]   Fax:  [phone]  Speech Language Pathology Treatment  Patient Details  Name: Kathleen [place] MRN: [id number] Date of Birth: 1985-08-18 Referring Provider (SLP): Dr. Andee Poles   Encounter Date: 05/22/2020   End of Session - 05/23/20 1430    Visit Number 8    Number of Visits 25    Date for SLP Re-Evaluation 07/09/20    Authorization Type BCBS    Authorization - Visit Number 8    Progress Note Due on Visit 10    SLP Start Time 1000    SLP Stop Time  1100    SLP Time Calculation (min) 60 min    Activity Tolerance Patient tolerated treatment well           Past Medical History:  Diagnosis Date  . Arthritis   . DVT (deep venous thrombosis) (HCC)     Past Surgical History:  Procedure Laterality Date  . PERIPHERAL VASCULAR THROMBECTOMY Right 06/15/2019   Procedure: PERIPHERAL VASCULAR THROMBECTOMY;  Surgeon: Annice Needy, MD;  Location: [facility] INVASIVE CV LAB;  Service: Cardiovascular;  Laterality: Right;    There were no vitals filed for this visit.   Subjective Assessment - 05/23/20 1422    Subjective Had ENT follow-up on Friday    Currently in Pain? Yes    Pain Score 1     Pain Location Throat                 ADULT SLP TREATMENT - 05/23/20 1422      General Information   Behavior/Cognition Alert;Cooperative;Pleasant mood    HPI Patient is a 35 y.o. 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      Treatment Provided   Treatment provided Cognitive-Linquistic       Cognitive-Linquistic Treatment   Treatment focused on Voice;Patient/family/caregiver education    Skilled Treatment Patient saw Dr. Andee Poles on Friday; received report showing improvements but still intermittent MTD, also noted some improved reflux changes. Bilateral nodules were noted which MD felt due to MTD vs RA due to symmetry. Ongoing education with pt re: vocal hygiene. Provided self-monitoring worksheet for pt to track/rate vocal hygiene at home, including H20 and caffeine intake, vocal rest, confidential voice, reflux management. Pt independently performed laryngeal relaxation and initiated abdominal breathing. Continues in conversation to speak on residual capacity, without awareness intially. With multiparagraph reading pt able to maintain abdominal breathing (AB), confidential voice and adequate vocal quality ~85% of the time. Progressed to conversation with occasional mod cues necessary ultimately able to fade support to min A.      Assessment / Recommendations / Plan   Plan Continue with current plan of care      Progression Toward Goals   Progression toward goals Progressing toward goals            SLP Education - 05/23/20 1430    Education Details vocal hygiene            SLP Short Term Goals - 04/11/20 0844      SLP SHORT TERM GOAL #1   Title  Pt will demo HEP for breath support, MTD accurately with rare min cues.    Time 10    Period --   sessions   Status New      SLP SHORT TERM GOAL #2   Title Patient will use abdominal breathing >90% accuracy in structured sentence level tasks.    Time 10    Period --   sessions   Status New      SLP SHORT TERM GOAL #3   Title Patient will ID tension/strain >90% accuracy in phrase level tasks.    Time 10    Period --   sessions   Status New      SLP SHORT TERM GOAL #4   Title Patient will maintain adequate vocal quality and endurance in 5 minute conversation >85% of the time using abdominal breathing and resonant  voice techniques.    Time 10    Period Weeks    Status New            SLP Long Term Goals - 04/11/20 0845      SLP LONG TERM GOAL #1   Title Pt will report carryover of 4 vocal hygiene techniques between 3 consecutive sessions.    Time 12    Period Weeks    Status New      SLP LONG TERM GOAL #2   Title Patient will maintain adequate vocal quality/endurance in 20 minute conversation using abdominal breathing and/or resonant voice >85% of the time.    Time 12    Period Weeks    Status New      SLP LONG TERM GOAL #3   Title Patient will report improvement in voice outcome as measured by Voice Handicap Index.    Baseline z score 4.63 = severe    Time 12    Period Weeks    Status New            Plan - 05/23/20 1431    Clinical Impression Statement ENT noted improvements however MTD/nodules not yet resolved. Patient is progressing with ability to use abdominal breathing and confidential voice in structured tasks with some carryover to conversation with min-mod cues. Continues to improve awareness of vocal hygiene and is making efforts to improve factors that are within her control, although has increased vocal demands with special needs child at home.    I recommend skilled ST to train pt in vocal hygiene, improve breath support for voice and reduce laryngeal tension in order to improve vocal quality, endurance, and quality of life.    Speech Therapy Frequency 2x / week    Duration 12 weeks   estimate 12-18 sessions total   Treatment/Interventions SLP instruction and feedback;Cueing hierarchy;Environmental controls;Functional tasks;Patient/family education;Compensatory strategies;Other (comment)   abdominal breathing, laryngeal relaxation, resonant voice therapy   Potential to Achieve Goals Good    Potential Considerations Other (comment)    SLP Home Exercise Plan see pt instructions    Consulted and Agree with Plan of Care Patient           Patient will benefit from skilled  therapeutic intervention in order to improve the following deficits and impairments:   Dysphonia  Vocal cord nodule  Muscle tension dysphonia    Problem List Patient Active Problem List   Diagnosis Date Noted  . DVT (deep venous thrombosis) (HCC) 06/14/2019  . Pleuritic chest pain 05/02/2019  . Right knee pain 05/02/2019  . Encounter for long-term (current) use of high-risk medication 07/18/2016  . Seronegative  rheumatoid arthritis (HCC) 07/18/2016  . Chronic inflammatory arthritis 06/16/2016   Rondel Baton, MS, CCC-SLP Speech-Language Pathologist   Arlana Lindau 05/23/2020, 2:34 PM  [place] [facility] MAIN [facility] SERVICES [address] [place], [place], [address] Phone: [phone]   Fax:  [phone]   Name: Batina Dougan MRN: [id number] Date of Birth: [birth year]

## 2020-05-24 ENCOUNTER — Ambulatory Visit: Payer: BC Managed Care – PPO | Admitting: Speech Pathology

## 2020-05-24 ENCOUNTER — Other Ambulatory Visit: Payer: Self-pay

## 2020-05-24 DIAGNOSIS — R490 Dysphonia: Secondary | ICD-10-CM

## 2020-05-24 DIAGNOSIS — J382 Nodules of vocal cords: Secondary | ICD-10-CM | POA: Diagnosis not present

## 2020-05-24 NOTE — Therapy (Signed)
[place] [facility] MAIN [facility] SERVICES [address] [place], [place], [address] Phone: [phone]   Fax:  [phone]  Speech Language Pathology Treatment  Patient Details  Name: Kathleen Lindsey MRN: [id number] Date of Birth: 05/05/1985 Referring Provider (SLP): Dr. Andee Poles   Encounter Date: 05/24/2020   End of Session - 05/24/20 1415    Visit Number 9    Number of Visits 25    Date for SLP Re-Evaluation 07/09/20    Authorization Type BCBS    Authorization - Visit Number 9    Progress Note Due on Visit 10    SLP Start Time 1000    SLP Stop Time  1100    SLP Time Calculation (min) 60 min    Activity Tolerance Patient tolerated treatment well           Past Medical History:  Diagnosis Date  . Arthritis   . DVT (deep venous thrombosis) (HCC)     Past Surgical History:  Procedure Laterality Date  . PERIPHERAL VASCULAR THROMBECTOMY Right 06/15/2019   Procedure: PERIPHERAL VASCULAR THROMBECTOMY;  Surgeon: Annice Needy, MD;  Location: [facility] INVASIVE CV LAB;  Service: Cardiovascular;  Laterality: Right;    There were no vitals filed for this visit.   Subjective Assessment - 05/24/20 1003    Subjective "I did use my voice a lot yesterday."    Currently in Pain? Yes    Pain Score 1     Pain Location Throat                 ADULT SLP TREATMENT - 05/24/20 1004      General Information   Behavior/Cognition Alert;Cooperative;Pleasant mood    HPI Patient is a 35 y.o. 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      Treatment Provided   Treatment provided  Cognitive-Linquistic      Cognitive-Linquistic Treatment   Treatment focused on Voice;Patient/family/caregiver education    Skilled Treatment Pt entered treatment room using confidential voice. Reports tracking vocal hygiene at home. Independently completed relaxation and initiated abdominal breathing. 5 minutes conversation using confidential voice/AB, min verbal cue needed for awareness of return to strained quality; pt recognized need to implement relaxation techniques before resuming conversation. Continued with mod complex conversation, with occasional min non-verbal cues necessary for pt to correct inadequate breath support or strain.      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    Time 10    Period --   sessions  Status New      SLP SHORT TERM GOAL #2   Title Patient will use abdominal breathing >90% accuracy in structured sentence level tasks.    Time 10    Period --   sessions   Status New      SLP SHORT TERM GOAL #3   Title Patient will ID tension/strain >90% accuracy in phrase level tasks.    Time 10    Period --   sessions   Status New      SLP SHORT TERM GOAL #4   Title Patient will maintain adequate vocal quality and endurance in 5 minute conversation >85% of the time using abdominal breathing and resonant voice techniques.    Time 10    Period Weeks    Status New            SLP Long Term Goals - 04/11/20 0845      SLP LONG TERM GOAL #1   Title Pt will report  carryover of 4 vocal hygiene techniques between 3 consecutive sessions.    Time 12    Period Weeks    Status New      SLP LONG TERM GOAL #2   Title Patient will maintain adequate vocal quality/endurance in 20 minute conversation using abdominal breathing and/or resonant voice >85% of the time.    Time 12    Period Weeks    Status New      SLP LONG TERM GOAL #3   Title Patient will report improvement in voice outcome as measured by Voice Handicap Index.    Baseline z score 4.63 = severe    Time 12    Period Weeks    Status New            Plan - 05/24/20 1416    Clinical Impression Statement Overall mild dysphonia today. More difficulty when conversation is more casual/less structured although began to demonstrate improved awareness/control of voicing by end of session. Continues to improve awareness of vocal hygiene and is making efforts to improve factors that are within her control, although has increased vocal demands with special needs child at home. I recommend skilled ST to train pt in vocal hygiene, improve breath support for voice and reduce laryngeal tension in order to improve vocal quality, endurance, and quality of life.    Speech Therapy Frequency 2x / week    Duration 12 weeks   estimate 12-18 sessions total   Treatment/Interventions SLP instruction and feedback;Cueing hierarchy;Environmental controls;Functional tasks;Patient/family education;Compensatory strategies;Other (comment)   abdominal breathing, laryngeal relaxation, resonant voice therapy   Potential to Achieve Goals Good    Potential Considerations Other (comment)    SLP Home Exercise Plan see pt instructions    Consulted and Agree with Plan of Care Patient           Patient will benefit from skilled therapeutic intervention in order to improve the following deficits and impairments:   Dysphonia  Vocal cord nodule  Muscle tension dysphonia    Problem List Patient Active Problem List   Diagnosis  Date Noted  . DVT (deep venous thrombosis) (HCC) 06/14/2019  . Pleuritic chest pain 05/02/2019  . Right knee pain 05/02/2019  . Encounter for long-term (current) use of high-risk medication 07/18/2016  . Seronegative rheumatoid arthritis (HCC) 07/18/2016  . Chronic inflammatory arthritis 06/16/2016   Rondel Baton, MS, CCC-SLP Speech-Language Pathologist  Arlana Lindau 05/24/2020, 2:17 PM  [place] [facility] MAIN [facility] SERVICES [address]  [place], [place], [address] Phone: [phone]   Fax:  [phone]   Name: Kathleen Lindsey MRN: [id number] Date of Birth: 1985-05-04

## 2020-05-29 ENCOUNTER — Ambulatory Visit: Payer: BC Managed Care – PPO | Admitting: Speech Pathology

## 2020-05-30 DIAGNOSIS — M0600 Rheumatoid arthritis without rheumatoid factor, unspecified site: Secondary | ICD-10-CM | POA: Diagnosis not present

## 2020-05-31 ENCOUNTER — Ambulatory Visit: Payer: BC Managed Care – PPO | Attending: Otolaryngology | Admitting: Speech Pathology

## 2020-05-31 ENCOUNTER — Ambulatory Visit: Payer: BC Managed Care – PPO | Admitting: Speech Pathology

## 2020-05-31 ENCOUNTER — Other Ambulatory Visit: Payer: Self-pay

## 2020-05-31 DIAGNOSIS — R490 Dysphonia: Secondary | ICD-10-CM | POA: Diagnosis not present

## 2020-05-31 DIAGNOSIS — J382 Nodules of vocal cords: Secondary | ICD-10-CM | POA: Insufficient documentation

## 2020-05-31 NOTE — Therapy (Signed)
[place] MAIN [facility] SERVICES [address] [place], [place], [address] Phone: [phone]   Fax:  [phone]  Speech Language Pathology Treatment and Progress Update  Patient Details  Name: Kathleen Lindsey MRN: [id number] Date of Birth: 08/24/1985 Referring Provider (SLP): Dr. Pryor Ochoa  Speech Therapy Progress Note  Dates of Reporting Period: 04/10/20 to 05/31/2020  Patient has been seen for 10 speech therapy sessions this reporting period targeting dysphonia and MTD. Progressing toward LTG goals; met 4/4 STGs. See skilled intervention, clinical impression, and goals below for details.   Encounter Date: 05/31/2020   End of Session - 05/31/20 1255    Visit Number 10    Number of Visits 25    Date for SLP Re-Evaluation 07/09/20    Authorization Type BCBS    Authorization - Visit Number 10    Progress Note Due on Visit 10    SLP Start Time 1000    SLP Stop Time  1050    SLP Time Calculation (min) 50 min    Activity Tolerance Patient tolerated treatment well           Past Medical History:  Diagnosis Date  . Arthritis   . DVT (deep venous thrombosis) ([place])     Past Surgical History:  Procedure Laterality Date  . PERIPHERAL VASCULAR THROMBECTOMY Right 06/15/2019   Procedure: PERIPHERAL VASCULAR THROMBECTOMY;  Surgeon: Algernon Huxley, MD;  Location: [place] CV LAB;  Service: Cardiovascular;  Laterality: Right;    There were no vitals filed for this visit.   Subjective Assessment - 05/31/20 1008    Subjective Had reflux incident last night.    Currently in Pain? Yes    Pain Score 4     Pain Location Throat                 ADULT SLP TREATMENT - 05/31/20 1011      General Information   Behavior/Cognition Alert;Cooperative;Pleasant mood    HPI Patient is a 35 y.o. female with past medical history noted for DVT, RA, OSA, GERD referred by Dr. Pryor Ochoa for muscle tension dysphonia. 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      Treatment Provided   Treatment provided Cognitive-Linquistic      Cognitive-Linquistic Treatment   Treatment focused on Voice;Patient/family/caregiver education    Skilled Treatment Patient self-corrected strain, used abdominal breathing in initial conversation with SLP. Reports completing her relaxation in the waiting room prior to session. Had setback this week with her voice; her son did very well in special Olympics and she couldn't help but cheer for him. Has tried to rest her voice since then but had bad reflux last night; woke up coughing, vomited and regurgitated. Children are leaving for a few days and she is planning to do total voice rest. Pt used abdominal breathing and confidential voice in conversation today ~85%, speaking less on residual capacity than in prior sessions. Able to ID/correct strain ~90% of the time.      Assessment / Recommendations / Plan   Plan Continue with current plan of care      Progression Toward Goals   Progression toward goals Progressing toward goals            SLP Education - 05/31/20  [place]    Education Details tracking Neurosurgeon) Educated Patient    Methods Explanation    Comprehension Verbalized understanding            SLP Short Term Goals - 05/31/20 1258      SLP SHORT TERM GOAL #1   Title Pt will demo HEP for breath support, MTD accurately with rare min cues.    Time 10    Period --   sessions   Status Achieved      SLP SHORT TERM GOAL #2   Title Patient will use abdominal breathing >90% accuracy in structured sentence level tasks.    Time 10    Period --   sessions   Status Achieved      SLP SHORT TERM GOAL #3   Title Patient will ID  tension/strain >90% accuracy in phrase level tasks.    Time 10    Period --   sessions   Status Achieved      SLP SHORT TERM GOAL #4   Title Patient will maintain adequate vocal quality and endurance in 5 minute conversation >85% of the time using abdominal breathing and resonant voice techniques.    Time 10    Period Weeks    Status Achieved            SLP Long Term Goals - 05/31/20 1258      SLP LONG TERM GOAL #1   Title Pt will report carryover of 4 vocal hygiene techniques between 3 consecutive sessions.    Time 12    Period Weeks    Status On-going      SLP LONG TERM GOAL #2   Title Patient will maintain adequate vocal quality/endurance in 20 minute conversation using abdominal breathing and/or resonant voice >85% of the time.    Time 12    Period Weeks    Status On-going      SLP LONG TERM GOAL #3   Title Patient will report improvement in voice outcome as measured by Voice Handicap Index.    Baseline z score 4.63 = severe    Time 12    Period Weeks    Status On-going            Plan - 05/31/20 1255    Clinical Impression Statement Patient is improving awareness and ability to correct strained vocal quality using relaxation and breath support within sessions; making efforts to carry over at home but acknowledges this is inconsistent. Continues to improve awareness of vocal hygiene and is making efforts to improve factors that are within her control, although has increased vocal demands with special needs child at home. Will benefit from continued ST to maximize carryover to conversations at home. I recommend skilled ST to train pt in vocal hygiene, improve breath support for voice and reduce laryngeal tension in order to improve vocal quality, endurance, and quality of life.    Speech Therapy Frequency 2x / week    Duration 12 weeks   estimate 12-18 sessions total   Treatment/Interventions SLP instruction and feedback;Cueing hierarchy;Environmental  controls;Functional tasks;Patient/family education;Compensatory strategies;Other (comment)   abdominal breathing, laryngeal relaxation, resonant voice therapy   Potential to Achieve Goals Good    Potential Considerations Other (comment)    SLP Home Exercise Plan see pt instructions    Consulted and Agree with Plan of Care Patient           Patient will benefit from skilled therapeutic intervention in order to  improve the following deficits and impairments:   Dysphonia  Vocal cord nodule  Muscle tension dysphonia    Problem List Patient Active Problem List   Diagnosis Date Noted  . DVT (deep venous thrombosis) ([place]) 06/14/2019  . Pleuritic chest pain 05/02/2019  . Right knee pain 05/02/2019  . Encounter for long-term (current) use of high-risk medication 07/18/2016  . Seronegative rheumatoid arthritis ([place]) 07/18/2016  . Chronic inflammatory arthritis 06/16/2016   Deneise Lever, [place], [place] Speech-Language Pathologist  Aliene Altes 05/31/2020, 12:59 PM  [place] MAIN [facility] SERVICES [address] [place], [place], [address] Phone: [phone]   Fax:  [phone]   Name: Kathleen Lindsey MRN: [id number] Date of Birth: 12/22/1985

## 2020-06-05 ENCOUNTER — Ambulatory Visit: Payer: BC Managed Care – PPO | Admitting: Speech Pathology

## 2020-06-06 DIAGNOSIS — M65311 Trigger thumb, right thumb: Secondary | ICD-10-CM | POA: Diagnosis not present

## 2020-06-06 DIAGNOSIS — M65312 Trigger thumb, left thumb: Secondary | ICD-10-CM | POA: Diagnosis not present

## 2020-06-07 ENCOUNTER — Ambulatory Visit: Payer: BC Managed Care – PPO | Admitting: Speech Pathology

## 2020-06-12 ENCOUNTER — Ambulatory Visit: Payer: BC Managed Care – PPO | Admitting: Speech Pathology

## 2020-06-16 ENCOUNTER — Other Ambulatory Visit (INDEPENDENT_AMBULATORY_CARE_PROVIDER_SITE_OTHER): Payer: Self-pay | Admitting: Vascular Surgery

## 2020-06-19 ENCOUNTER — Other Ambulatory Visit: Payer: Self-pay

## 2020-06-19 ENCOUNTER — Ambulatory Visit (INDEPENDENT_AMBULATORY_CARE_PROVIDER_SITE_OTHER): Payer: BC Managed Care – PPO | Admitting: Vascular Surgery

## 2020-06-19 ENCOUNTER — Encounter (INDEPENDENT_AMBULATORY_CARE_PROVIDER_SITE_OTHER): Payer: Self-pay | Admitting: Vascular Surgery

## 2020-06-19 VITALS — BP 127/92 | HR 75 | Ht 64.0 in | Wt 304.0 lb

## 2020-06-19 DIAGNOSIS — I82411 Acute embolism and thrombosis of right femoral vein: Secondary | ICD-10-CM

## 2020-06-19 DIAGNOSIS — M0600 Rheumatoid arthritis without rheumatoid factor, unspecified site: Secondary | ICD-10-CM

## 2020-06-19 NOTE — Progress Notes (Signed)
MRN : [id number]  Kathleen Lindsey is a 35 y.o. (March 08, 1985) female who presents with chief complaint of  Chief Complaint  Patient presents with  . Follow-up    6 Mo no studies  .  History of Present Illness: Patient returns today in follow up of her previous DVT and postphlebitic symptoms.  Her swelling and pain are quite mild.  She underwent thrombectomy a little over a year ago.  She is tolerating Eliquis 2.5 mg twice daily although she does still have some increased menstrual bleeding.  No new complaints today.  Current Outpatient Medications  Medication Sig Dispense Refill  . acetaminophen (TYLENOL) 650 MG CR tablet Take by mouth.    Marland Kitchen albuterol (VENTOLIN HFA) 108 (90 Base) MCG/ACT inhaler Inhale 2 puffs into the lungs every 6 (six) hours as needed for wheezing or shortness of breath. 8 g 1  . colchicine 0.6 MG tablet Take 0.6 mg by mouth 2 (two) times daily.    Marland Kitchen ELIQUIS 2.5 MG TABS tablet TAKE 1 TABLET BY MOUTH TWICE A DAY 60 tablet 5  . ELIQUIS 5 MG TABS tablet Take 5 mg by mouth 2 (two) times daily.    . ferrous sulfate 325 (65 FE) MG tablet Take 325 mg by mouth daily.    Marland Kitchen HYDROcodone-acetaminophen (NORCO/VICODIN) 5-325 MG tablet Take 1 tablet by mouth 2 (two) times daily as needed.    Marland Kitchen omeprazole (PRILOSEC) 40 MG capsule Take by mouth.    . pantoprazole (PROTONIX) 40 MG tablet Take by mouth.    . phentermine 37.5 MG capsule Take 37.5 mg by mouth every morning.    . predniSONE (DELTASONE) 10 MG tablet Take 10 mg by mouth daily.     No current facility-administered medications for this visit.    Past Medical History:  Diagnosis Date  . Arthritis   . DVT (deep venous thrombosis) (HCC)     Past Surgical History:  Procedure Laterality Date  . PERIPHERAL VASCULAR THROMBECTOMY Right 06/15/2019   Procedure: PERIPHERAL VASCULAR THROMBECTOMY;  Surgeon: Annice Needy, MD;  Location: [facility] INVASIVE CV LAB;  Service: Cardiovascular;  Laterality: Right;    Social  History        Tobacco Use  . Smoking status: Never Smoker  . Smokeless tobacco: Never Used  Substance Use Topics  . Alcohol use: Yes    Comment: rare  . Drug use: Never           Family History  Problem Relation Age of Onset  . Cancer Mother   . Diabetes Mother   no bleeding or clotting disorders  No Known Allergies   REVIEW OF SYSTEMS (Negative unless checked)  Constitutional: [] ?Weight loss  [] ?Fever  [] ?Chills Cardiac: [] ?Chest pain   [] ?Chest pressure   [] ?Palpitations   [] ?Shortness of breath when laying flat   [] ?Shortness of breath at rest   [] ?Shortness of breath with exertion. Vascular:  [] ?Pain in legs with walking   [] ?Pain in legs at rest   [] ?Pain in legs when laying flat   [] ?Claudication   [] ?Pain in feet when walking  [] ?Pain in feet at rest  [] ?Pain in feet when laying flat   [x] ?History of DVT   [x] ?Phlebitis   [x] ?Swelling in legs   [] ?Varicose veins   [] ?Non-healing ulcers Pulmonary:   [] ?Uses home oxygen   [] ?Productive cough   [] ?Hemoptysis   [] ?Wheeze  [] ?COPD   [] ?Asthma Neurologic:  [] ?Dizziness  [] ?Blackouts   [] ?Seizures   [] ?History of stroke   [] ?  History of TIA  [] ?Aphasia   [] ?Temporary blindness   [] ?Dysphagia   [] ?Weakness or numbness in arms   [] ?Weakness or numbness in legs Musculoskeletal:  [x] ?Arthritis   [] ?Joint swelling   [] ?Joint pain   [] ?Low back pain Hematologic:  [] ?Easy bruising  [] ?Easy bleeding   [] ?Hypercoagulable state   [] ?Anemic   Gastrointestinal:  [] ?Blood in stool   [] ?Vomiting blood  [] ?Gastroesophageal reflux/heartburn   [] ?Abdominal pain Genitourinary:  [] ?Chronic kidney disease   [] ?Difficult urination  [] ?Frequent urination  [] ?Burning with urination   [] ?Hematuria Skin:  [] ?Rashes   [] ?Ulcers   [] ?Wounds Psychological:  [] ?History of anxiety   [] ? History of major depression.    Physical Examination  BP (!) 127/92   Pulse 75   Ht 5\' 4"  (1.626 m)   Wt (!) 304 lb (137.9 kg)   BMI 52.18 kg/m  Gen:   WD/WN, NAD.  Obese Head: [place]/AT, No temporalis wasting. Ear/Nose/Throat: Hearing grossly intact, nares w/o erythema or drainage Eyes: Conjunctiva clear. Sclera non-icteric Neck: Supple.  Trachea midline Pulmonary:  Good air movement, no use of accessory muscles.  Cardiac: RRR, no JVD Vascular:  Vessel Right Left  Radial Palpable Palpable                          PT Palpable Palpable  DP Palpable Palpable   Gastrointestinal: soft, non-tender/non-distended. No guarding/reflex.  Musculoskeletal: M/S 5/5 throughout.  No deformity or atrophy.  Trace bilateral lower extremity edema. Neurologic: Sensation grossly intact in extremities.  Symmetrical.  Speech is fluent.  Psychiatric: Judgment intact, Mood & affect appropriate for pt's clinical situation. Dermatologic: No rashes or ulcers noted.  No cellulitis or open wounds.       Labs No results found for this or any previous visit (from the past 2160 hour(s)).  Radiology No results found.  Assessment/Plan Seronegative rheumatoid arthritis (HCC) Not being able to take NSAIDs has been problematic as she has not been able to get her infusions and her RA has had intermittent flares.  Lowering her dose of Eliquis allowed her to take intermittent NSAIDs with a low risk of bleeding.  DVT (deep venous thrombosis) (HCC) Previous right lower extremity thrombectomy with good results.  Minimal postphlebitic symptoms.  She is at high risk of recurrence given her size and young age.  She will remain on 2.5 mg of Eliquis twice daily indefinitely.  Return to clinic in 1 year.    , MD  06/19/2020 11:03 AM    This note was created with Dragon medical transcription system.  Any errors from dictation are purely unintentional

## 2020-06-19 NOTE — Assessment & Plan Note (Signed)
Previous right lower extremity thrombectomy with good results.  Minimal postphlebitic symptoms.  She is at high risk of recurrence given her size and young age.  She will remain on 2.5 mg of Eliquis twice daily indefinitely.  Return to clinic in 1 year.

## 2020-06-20 ENCOUNTER — Ambulatory Visit: Payer: BC Managed Care – PPO | Admitting: Speech Pathology

## 2020-06-20 DIAGNOSIS — J382 Nodules of vocal cords: Secondary | ICD-10-CM | POA: Diagnosis not present

## 2020-06-20 DIAGNOSIS — R490 Dysphonia: Secondary | ICD-10-CM | POA: Diagnosis not present

## 2020-06-21 NOTE — Therapy (Signed)
[place] [facility] MAIN [facility] SERVICES [address] [place], [place], [address] Phone: [phone]   Fax:  [phone]  Speech Language Pathology Treatment  Patient Details  Name: Kathleen Lindsey MRN: [id number] Date of Birth: [birth year] Referring Provider (SLP): Dr. Andee Poles   Encounter Date: 06/20/2020   End of Session - 06/21/20 0824    Visit Number 11    Number of Visits 25    Date for SLP Re-Evaluation 07/09/20    Authorization Type BCBS    Authorization - Visit Number 1    Progress Note Due on Visit 10    SLP Start Time 1500    SLP Stop Time  1600    SLP Time Calculation (min) 60 min    Activity Tolerance Patient tolerated treatment well           Past Medical History:  Diagnosis Date  . Arthritis   . DVT (deep venous thrombosis) (HCC)     Past Surgical History:  Procedure Laterality Date  . PERIPHERAL VASCULAR THROMBECTOMY Right 06/15/2019   Procedure: PERIPHERAL VASCULAR THROMBECTOMY;  Surgeon: Annice Needy, MD;  Location: [facility] INVASIVE CV LAB;  Service: Cardiovascular;  Laterality: Right;    There were no vitals filed for this visit.   Subjective Assessment - 06/21/20 0818    Subjective "It's been used" re: voice    Currently in Pain? Yes    Pain Score 4     Pain Location Chest    Pain Orientation Right;Left                 ADULT SLP TREATMENT - 06/21/20 0819      General Information   Behavior/Cognition Alert;Cooperative;Pleasant mood    HPI Patient is a 35 y.o. 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      Treatment Provided   Treatment  provided Cognitive-Linquistic      Cognitive-Linquistic Treatment   Treatment focused on Voice;Patient/family/caregiver education    Skilled Treatment Patient reports RA flare starting about 1.5 weeks ago. Has pleurisy and as a result having difficulty taking deeper breaths. Initial vocal quality was hoarse, strained in session today. Pt used stretches for relaxation, and hum progressing through heirarchical tasks with resonant voicing (words, phrases, sentences, paragraphs) ~90% accuracy, and gradually able to move into conversation (20 minutes) using confidential voice and resonant voicing techniques with occasional min cues. Carryover remains a challenge for her at home, suggested using warm-up and stretches before her kids get home to help her reduce strain/glottal attacks and remind herself to use confidential voice.      Assessment / Recommendations / Plan   Plan Continue with current plan of care      Progression Toward Goals   Progression toward goals Progressing toward goals            SLP Education - 06/21/20 0823    Education Details warm up before vocal demands    Person(s) Educated Patient    Methods Explanation    Comprehension Verbalized understanding            SLP Short Term Goals - 05/31/20 1258      SLP SHORT  TERM GOAL #1   Title Pt will demo HEP for breath support, MTD accurately with rare min cues.    Time 10    Period --   sessions   Status Achieved      SLP SHORT TERM GOAL #2   Title Patient will use abdominal breathing >90% accuracy in structured sentence level tasks.    Time 10    Period --   sessions   Status Achieved      SLP SHORT TERM GOAL #3   Title Patient will ID tension/strain >90% accuracy in phrase level tasks.    Time 10    Period --   sessions   Status Achieved      SLP SHORT TERM GOAL #4   Title Patient will maintain adequate vocal quality and endurance in 5 minute conversation >85% of the time using abdominal breathing and resonant  voice techniques.    Time 10    Period Weeks    Status Achieved            SLP Long Term Goals - 05/31/20 1258      SLP LONG TERM GOAL #1   Title Pt will report carryover of 4 vocal hygiene techniques between 3 consecutive sessions.    Time 12    Period Weeks    Status On-going      SLP LONG TERM GOAL #2   Title Patient will maintain adequate vocal quality/endurance in 20 minute conversation using abdominal breathing and/or resonant voice >85% of the time.    Time 12    Period Weeks    Status On-going      SLP LONG TERM GOAL #3   Title Patient will report improvement in voice outcome as measured by Voice Handicap Index.    Baseline z score 4.63 = severe    Time 12    Period Weeks    Status On-going            Plan - 06/21/20 0824    Clinical Impression Statement Patient is improving awareness and ability to correct strained vocal quality using relaxation and breath support within sessions; making efforts to carry over at home but acknowledges this is inconsistent. Continues to improve awareness of vocal hygiene and is making efforts to improve factors that are within her control, although has increased vocal demands with special needs child at home. Will benefit from continued ST to maximize carryover to conversations at home. I recommend skilled ST to train pt in vocal hygiene, improve breath support for voice and reduce laryngeal tension in order to improve vocal quality, endurance, and quality of life.    Speech Therapy Frequency 2x / week    Duration 12 weeks   estimate 12-18 sessions total   Treatment/Interventions SLP instruction and feedback;Cueing hierarchy;Environmental controls;Functional tasks;Patient/family education;Compensatory strategies;Other (comment)   abdominal breathing, laryngeal relaxation, resonant voice therapy   Potential to Achieve Goals Good    Potential Considerations Other (comment)    SLP Home Exercise Plan see pt instructions    Consulted and  Agree with Plan of Care Patient           Patient will benefit from skilled therapeutic intervention in order to improve the following deficits and impairments:   Dysphonia  Vocal cord nodule  Muscle tension dysphonia    Problem List Patient Active Problem List   Diagnosis Date Noted  . DVT (deep venous thrombosis) (HCC) 06/14/2019  . Pleuritic chest pain 05/02/2019  . Right knee pain 05/02/2019  .  Encounter for long-term (current) use of high-risk medication 07/18/2016  . Seronegative rheumatoid arthritis (HCC) 07/18/2016  . Chronic inflammatory arthritis 06/16/2016   Rondel Baton, MS, CCC-SLP Speech-Language Pathologist  Arlana Lindau 06/21/2020, 8:25 AM  [facility] MAIN [facility] SERVICES [address] [place], [place], [address] Phone: [phone]   Fax:  [phone]   Name: Kathleen Lindsey MRN: [id number] Date of Birth: [birth year]

## 2020-06-27 ENCOUNTER — Other Ambulatory Visit: Payer: Self-pay

## 2020-06-27 ENCOUNTER — Ambulatory Visit: Payer: BC Managed Care – PPO | Attending: Otolaryngology | Admitting: Speech Pathology

## 2020-06-27 DIAGNOSIS — J382 Nodules of vocal cords: Secondary | ICD-10-CM

## 2020-06-27 DIAGNOSIS — R490 Dysphonia: Secondary | ICD-10-CM | POA: Diagnosis not present

## 2020-06-27 DIAGNOSIS — M0600 Rheumatoid arthritis without rheumatoid factor, unspecified site: Secondary | ICD-10-CM | POA: Diagnosis not present

## 2020-06-27 NOTE — Therapy (Signed)
[facility] MAIN [facility] SERVICES [address] [place], [place], [address] Phone: [phone]   Fax:  [phone]  Speech Language Pathology Treatment  Patient Details  Name: Kathleen Lindsey MRN: [id number] Date of Birth: 1985-10-13 Referring Provider (SLP): Dr. Andee Poles   Encounter Date: 06/27/2020   End of Session - 06/27/20 1802    Visit Number 12    Number of Visits 25    Date for SLP Re-Evaluation 07/09/20    Authorization Type BCBS    Authorization - Visit Number 2    Progress Note Due on Visit 10    SLP Start Time 1500    SLP Stop Time  1548    SLP Time Calculation (min) 48 min    Activity Tolerance Patient tolerated treatment well;Other (comment)   tired after infusion today          Past Medical History:  Diagnosis Date  . Arthritis   . DVT (deep venous thrombosis) (HCC)     Past Surgical History:  Procedure Laterality Date  . PERIPHERAL VASCULAR THROMBECTOMY Right 06/15/2019   Procedure: PERIPHERAL VASCULAR THROMBECTOMY;  Surgeon: Annice Needy, MD;  Location: [facility] INVASIVE CV LAB;  Service: Cardiovascular;  Laterality: Right;    There were no vitals filed for this visit.   Subjective Assessment - 06/27/20 1508    Subjective Refrained from talking while walking to avoid straining her voice.    Currently in Pain? Yes    Pain Score 3     Pain Location Throat                 ADULT SLP TREATMENT - 06/27/20 1507      General Information   Behavior/Cognition Alert;Cooperative;Pleasant mood    HPI Patient is a 35 y.o. 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      Treatment Provided   Treatment provided Cognitive-Linquistic      Pain Assessment   Pain Assessment No/denies pain      Cognitive-Linquistic Treatment   Treatment focused on Voice;Patient/family/caregiver education    Skilled Treatment Patient reported feeling stuck at times even though "I know what I need to do." Felt she strained her voice this week with her daughter. After completing stretches/relaxation and breathing, pt read resonant words and phrases 90% accuracy (100% with self-correction), progressing to sentences 60% on first attempt, 100% with self-correction. In conversations (10 minutes, 20 minutes), pt use of confidential voice, resonant voice ~90%, although rare min cues necessary. Patient agreed she would track hours of voice use and rate a percentage of those that she uses her "good voice" at home.      Assessment / Recommendations / Plan   Plan Continue with current plan of care      Progression Toward Goals   Progression toward goals Progressing toward goals            SLP Education - 06/27/20 1802    Education Details track voice use    Person(s) Educated Patient    Methods Explanation    Comprehension Verbalized understanding            SLP Short Term Goals -  05/31/20 1258      SLP SHORT TERM GOAL #1   Title Pt will demo HEP for breath support, MTD accurately with rare min cues.    Time 10    Period --   sessions   Status Achieved      SLP SHORT TERM GOAL #2   Title Patient will use abdominal breathing >90% accuracy in structured sentence level tasks.    Time 10    Period --   sessions   Status Achieved      SLP SHORT TERM GOAL #3   Title Patient will ID tension/strain >90% accuracy in phrase level tasks.    Time 10    Period --   sessions   Status Achieved      SLP SHORT TERM GOAL #4   Title Patient will maintain adequate vocal quality and endurance in 5 minute conversation >85% of the time using abdominal breathing and  resonant voice techniques.    Time 10    Period Weeks    Status Achieved            SLP Long Term Goals - 05/31/20 1258      SLP LONG TERM GOAL #1   Title Pt will report carryover of 4 vocal hygiene techniques between 3 consecutive sessions.    Time 12    Period Weeks    Status On-going      SLP LONG TERM GOAL #2   Title Patient will maintain adequate vocal quality/endurance in 20 minute conversation using abdominal breathing and/or resonant voice >85% of the time.    Time 12    Period Weeks    Status On-going      SLP LONG TERM GOAL #3   Title Patient will report improvement in voice outcome as measured by Voice Handicap Index.    Baseline z score 4.63 = severe    Time 12    Period Weeks    Status On-going            Plan - 06/27/20 1803    Clinical Impression Statement Patient is improving awareness and ability to correct strained vocal quality using relaxation and breath support within sessions; making efforts to carry over at home but acknowledges this is inconsistent. Continues to improve awareness of vocal hygiene and is making efforts to improve factors that are within her control, although has increased vocal demands with special needs child at home. Encouraged pt to track voice use at home to improve carryover. Will benefit from continued ST to maximize carryover to conversations at home. I recommend skilled ST to train pt in vocal hygiene, improve breath support for voice and reduce laryngeal tension in order to improve vocal quality, endurance, and quality of life.    Speech Therapy Frequency 2x / week    Duration 12 weeks   estimate 12-18 sessions total   Treatment/Interventions SLP instruction and feedback;Cueing hierarchy;Environmental controls;Functional tasks;Patient/family education;Compensatory strategies;Other (comment)   abdominal breathing, laryngeal relaxation, resonant voice therapy   Potential to Achieve Goals Good    Potential Considerations Other  (comment)    SLP Home Exercise Plan see pt instructions    Consulted and Agree with Plan of Care Patient           Patient will benefit from skilled therapeutic intervention in order to improve the following deficits and impairments:   Dysphonia  Vocal cord nodule  Muscle tension dysphonia    Problem List Patient Active Problem List   Diagnosis Date Noted  .  DVT (deep venous thrombosis) (HCC) 06/14/2019  . Pleuritic chest pain 05/02/2019  . Right knee pain 05/02/2019  . Encounter for long-term (current) use of high-risk medication 07/18/2016  . Seronegative rheumatoid arthritis (HCC) 07/18/2016  . Chronic inflammatory arthritis 06/16/2016   Rondel Baton, MS, CCC-SLP Speech-Language Pathologist  Arlana Lindau 06/27/2020, 6:04 PM  [place] [facility] MAIN [facility] SERVICES [address] [place], [place], [address] Phone: [phone]   Fax:  [phone]   Name: Kathleen Lindsey MRN: [id number] Date of Birth: 31-Aug-1985

## 2020-07-03 ENCOUNTER — Ambulatory Visit: Payer: BC Managed Care – PPO | Admitting: Speech Pathology

## 2020-07-05 ENCOUNTER — Ambulatory Visit: Payer: BC Managed Care – PPO | Admitting: Speech Pathology

## 2020-07-06 DIAGNOSIS — M0600 Rheumatoid arthritis without rheumatoid factor, unspecified site: Secondary | ICD-10-CM | POA: Diagnosis not present

## 2020-07-06 DIAGNOSIS — Z79899 Other long term (current) drug therapy: Secondary | ICD-10-CM | POA: Diagnosis not present

## 2020-07-06 DIAGNOSIS — R0781 Pleurodynia: Secondary | ICD-10-CM | POA: Diagnosis not present

## 2020-07-10 ENCOUNTER — Ambulatory Visit: Payer: BC Managed Care – PPO | Admitting: Speech Pathology

## 2020-07-10 ENCOUNTER — Other Ambulatory Visit: Payer: Self-pay

## 2020-07-10 DIAGNOSIS — J382 Nodules of vocal cords: Secondary | ICD-10-CM | POA: Diagnosis not present

## 2020-07-10 DIAGNOSIS — R490 Dysphonia: Secondary | ICD-10-CM

## 2020-07-11 NOTE — Addendum Note (Signed)
Addended by: Arlana Lindau on: 07/11/2020 04:18 PM   Modules accepted: Orders

## 2020-07-11 NOTE — Therapy (Addendum)
[place] MAIN [facility] SERVICES [place], [place], [address] Phone: [phone]   Fax:  [phone]  Speech Language Pathology Treatment/ Recertification/Discharge  Patient Details  Name: Kathleen Lindsey MRN: [id number] Date of Birth: 03/31/1985 Referring Provider (SLP): Dr. Pryor Ochoa   Encounter Date: 07/10/2020   End of Session - 07/11/20 1548     Visit Number 13    Number of Visits 25    Date for SLP Re-Evaluation 07/10/20    Authorization Type BCBS    Authorization - Visit Number 3    Progress Note Due on Visit 10    SLP Start Time 1338    SLP Stop Time  1430    SLP Time Calculation (min) 52 min    Activity Tolerance Patient tolerated treatment well             Past Medical History:  Diagnosis Date   Arthritis    DVT (deep venous thrombosis) ([place])     Past Surgical History:  Procedure Laterality Date   PERIPHERAL VASCULAR THROMBECTOMY Right 06/15/2019   Procedure: PERIPHERAL VASCULAR THROMBECTOMY;  Surgeon: Algernon Huxley, MD;  Location: [place] CV LAB;  Service: Cardiovascular;  Laterality: Right;    There were no vitals filed for this visit.   Subjective Assessment - 07/11/20 1546     Subjective "Dr. Posey Pronto said my voice sounded better."    Currently in Pain? Yes    Pain Score 3     Pain Location Throat                   ADULT SLP TREATMENT - 07/11/20 1547       General Information   Behavior/Cognition Alert;Cooperative;Pleasant mood    HPI Patient is a 35 y.o. female with past medical history noted for DVT, RA, OSA, GERD referred by Dr. Pryor Ochoa for muscle tension dysphonia. 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      Treatment  Provided   Treatment provided Cognitive-Linquistic      Cognitive-Linquistic Treatment   Treatment focused on Voice;Patient/family/caregiver education    Skilled Treatment Patient reports ready for d/c today; demonstrating independence, self-monitoring of vocal quality in conversation. Reassessment of voice and readministered VHI; see below for details.     Assessment / Recommendations / Plan   Plan Discharge SLP treatment due to (comment)   patient request     Progression Toward Goals   Progression toward goals --   goals partially met, education completed, patient discharged from ST           Objective Voice Measurements   Maximum phonation time for sustained "ah": 6.2 seconds   Average fundamental frequency during sustained "ah": 222 Hz  (<1 SD below 244 Hz +27 for gender)     Relative Average Perterbuation for "ah": 1.4 %  Shimmer: 4.1 %  Noise to Harmonic Ratio: 0.1  Voice Turbulence Index: 0.02   Habitual pitch: 216 Hz     Highest dynamic pitch in conversational speech: 393 Hz   Lowest dynamic pitch in conversational speech: 79 Hz   Average time patient was able to sustain /s/:  15.3 seconds   Average time patient was able to sustain /z/: 8.3 seconds   s/z ratio:  (suggestive of dysfunction >  1.0) 1.8    VHI Score: The Voice Handicap Index is comprised of a series of questions to assess the patient's perception of their voice. It is designed to evaluate the emotional, physical and functional components of the voice problem.  Functional: 9 Physical: 17 Emotional: 6 Total:  32  (Normal mean 8.75, SD =14.97) z score =  1.55 no significant impact = 0-1.00, mild = 1.01-1.99, moderate = 2.00-2.99, severe = 3.00+      SLP Education - 07/11/20 1548     Education Details continue vocal hygiene, healthy voice techniques    Person(s) Educated Patient    Methods Explanation    Comprehension Verbalized understanding              SLP Short Term Goals -  07/11/20 1549       SLP SHORT TERM GOAL #1   Title Pt will demo HEP for breath support, MTD accurately with rare min cues.    Time 10    Period --   sessions   Status Achieved      SLP SHORT TERM GOAL #2   Title Patient will use abdominal breathing >90% accuracy in structured sentence level tasks.    Time 10    Period --   sessions   Status Achieved      SLP SHORT TERM GOAL #3   Title Patient will ID tension/strain >90% accuracy in phrase level tasks.    Time 10    Period --   sessions   Status Achieved      SLP SHORT TERM GOAL #4   Title Patient will maintain adequate vocal quality and endurance in 5 minute conversation >85% of the time using abdominal breathing and resonant voice techniques.    Time 10    Period Weeks    Status Achieved              SLP Long Term Goals - 07/10/20 1342       SLP LONG TERM GOAL #1   Title Pt will report carryover of 4 vocal hygiene techniques between 3 consecutive sessions.    Time 12    Period Weeks    Status Partially Met      SLP LONG TERM GOAL #2   Title Patient will maintain adequate vocal quality/endurance in 20 minute conversation using abdominal breathing and/or resonant voice >85% of the time.    Time 12    Period Weeks    Status Achieved      SLP LONG TERM GOAL #3   Title Patient will report improvement in voice outcome as measured by Voice Handicap Index.    Baseline z score 4.63 = severe at evaluation; 6/14: z score 1.55 = mild    Time 12    Period Weeks    Status Achieved              Plan - 07/11/20 1600     Clinical Impression Statement Patient demonstrates improvements in vocal quality and function; able to maintain adequate voicing in 25 minutes conversation in treatment room today. Patient acknowledges barriers to maintaining this at home, however feels she is more aware of how she is using her voice and is making efforts to improve her vocal hygiene. Demonstrates improvements in vocal parameters as  well as self-rating of voice (see skilled intervention for details). Patient ready to discontinue ST at this time; she met 4/4 short term goals, fully met 2/3 LTGs and partially met 1/1 LTGs.  Speech Therapy Frequency --   d/c   Duration --   d/c   Treatment/Interventions SLP instruction and feedback;Cueing hierarchy;Environmental controls;Functional tasks;Patient/family education;Compensatory strategies;Other (comment)   abdominal breathing, resonant voicing, laryngeal relaxation   Potential Considerations Other (comment)   vocal demands at home   Consulted and Agree with Plan of Care Patient             Patient will benefit from skilled therapeutic intervention in order to improve the following deficits and impairments:   Dysphonia - Plan: SLP plan of care cert/re-cert  Vocal cord nodule - Plan: SLP plan of care cert/re-cert  Muscle tension dysphonia - Plan: SLP plan of care cert/re-cert    Problem List Patient Active Problem List   Diagnosis Date Noted   DVT (deep venous thrombosis) (HCC) 06/14/2019   Pleuritic chest pain 05/02/2019   Right knee pain 05/02/2019   Encounter for long-term (current) use of high-risk medication 07/18/2016   Seronegative rheumatoid arthritis (HCC) 07/18/2016   Chronic inflammatory arthritis 06/16/2016   SPEECH THERAPY DISCHARGE SUMMARY  Visits from Start of Care: 13  Current functional level related to goals / functional outcomes: Maintaining voicing 25 minutes conversation; met 4/4 STG, 2/3 LTG, partially met 1/3 LTG.   Remaining deficits: Intermittent hoarse vocal quality, difficulty projecting, and vocal fatigue, particularly with excessive/prolonged voice use.   Education / Equipment: Vocal hygiene, ongoing activities to maintain/improve function    Patient agrees to discharge. Patient goals were partially met. Patient is being discharged due to the patient's request..  Mary Beth Bardin, MS, CCC-SLP Speech-Language  Pathologist   Mary E Bardin 07/11/2020, 4:18 PM  [place] [facility] MAIN REHAB SERVICES [address] [place], [place], [address] Phone: [phone]   Fax:  [phone]   Name: Kathleen Lindsey MRN: [id number] Date of Birth: 08/07/1985  

## 2020-07-12 ENCOUNTER — Encounter: Payer: BC Managed Care – PPO | Admitting: Speech Pathology

## 2020-07-13 DIAGNOSIS — M0600 Rheumatoid arthritis without rheumatoid factor, unspecified site: Secondary | ICD-10-CM | POA: Diagnosis not present

## 2020-07-17 ENCOUNTER — Encounter: Payer: BC Managed Care – PPO | Admitting: Speech Pathology

## 2020-07-19 ENCOUNTER — Encounter: Payer: BC Managed Care – PPO | Admitting: Speech Pathology

## 2020-07-24 ENCOUNTER — Encounter: Payer: BC Managed Care – PPO | Admitting: Speech Pathology

## 2020-07-25 DIAGNOSIS — M0600 Rheumatoid arthritis without rheumatoid factor, unspecified site: Secondary | ICD-10-CM | POA: Diagnosis not present

## 2020-07-26 ENCOUNTER — Encounter: Payer: BC Managed Care – PPO | Admitting: Speech Pathology

## 2020-08-22 DIAGNOSIS — M0600 Rheumatoid arthritis without rheumatoid factor, unspecified site: Secondary | ICD-10-CM | POA: Diagnosis not present

## 2020-08-28 DIAGNOSIS — K219 Gastro-esophageal reflux disease without esophagitis: Secondary | ICD-10-CM | POA: Diagnosis not present

## 2020-09-19 DIAGNOSIS — M0600 Rheumatoid arthritis without rheumatoid factor, unspecified site: Secondary | ICD-10-CM | POA: Diagnosis not present

## 2020-10-17 DIAGNOSIS — M0600 Rheumatoid arthritis without rheumatoid factor, unspecified site: Secondary | ICD-10-CM | POA: Diagnosis not present

## 2020-11-14 DIAGNOSIS — M0600 Rheumatoid arthritis without rheumatoid factor, unspecified site: Secondary | ICD-10-CM | POA: Diagnosis not present

## 2020-11-14 DIAGNOSIS — M0609 Rheumatoid arthritis without rheumatoid factor, multiple sites: Secondary | ICD-10-CM | POA: Diagnosis not present

## 2020-11-14 DIAGNOSIS — R0781 Pleurodynia: Secondary | ICD-10-CM | POA: Diagnosis not present

## 2020-11-14 DIAGNOSIS — Z79899 Other long term (current) drug therapy: Secondary | ICD-10-CM | POA: Diagnosis not present

## 2020-11-27 DIAGNOSIS — M25562 Pain in left knee: Secondary | ICD-10-CM | POA: Diagnosis not present

## 2020-11-27 DIAGNOSIS — M25561 Pain in right knee: Secondary | ICD-10-CM | POA: Diagnosis not present

## 2020-12-12 DIAGNOSIS — M0600 Rheumatoid arthritis without rheumatoid factor, unspecified site: Secondary | ICD-10-CM | POA: Diagnosis not present

## 2020-12-21 ENCOUNTER — Other Ambulatory Visit (INDEPENDENT_AMBULATORY_CARE_PROVIDER_SITE_OTHER): Payer: Self-pay | Admitting: Vascular Surgery

## 2020-12-28 DIAGNOSIS — H1030 Unspecified acute conjunctivitis, unspecified eye: Secondary | ICD-10-CM | POA: Diagnosis not present

## 2020-12-28 DIAGNOSIS — Z6841 Body Mass Index (BMI) 40.0 and over, adult: Secondary | ICD-10-CM | POA: Diagnosis not present

## 2020-12-28 DIAGNOSIS — L259 Unspecified contact dermatitis, unspecified cause: Secondary | ICD-10-CM | POA: Diagnosis not present

## 2020-12-28 DIAGNOSIS — Z20822 Contact with and (suspected) exposure to covid-19: Secondary | ICD-10-CM | POA: Diagnosis not present

## 2021-01-02 DIAGNOSIS — H6691 Otitis media, unspecified, right ear: Secondary | ICD-10-CM | POA: Diagnosis not present

## 2021-01-02 DIAGNOSIS — Z23 Encounter for immunization: Secondary | ICD-10-CM | POA: Diagnosis not present

## 2021-01-02 DIAGNOSIS — Z6841 Body Mass Index (BMI) 40.0 and over, adult: Secondary | ICD-10-CM | POA: Diagnosis not present

## 2021-01-09 DIAGNOSIS — M0600 Rheumatoid arthritis without rheumatoid factor, unspecified site: Secondary | ICD-10-CM | POA: Diagnosis not present

## 2021-02-07 DIAGNOSIS — M0600 Rheumatoid arthritis without rheumatoid factor, unspecified site: Secondary | ICD-10-CM | POA: Diagnosis not present

## 2021-03-01 DIAGNOSIS — R490 Dysphonia: Secondary | ICD-10-CM | POA: Diagnosis not present

## 2021-03-13 DIAGNOSIS — M0600 Rheumatoid arthritis without rheumatoid factor, unspecified site: Secondary | ICD-10-CM | POA: Diagnosis not present

## 2021-03-13 DIAGNOSIS — M0609 Rheumatoid arthritis without rheumatoid factor, multiple sites: Secondary | ICD-10-CM | POA: Diagnosis not present

## 2021-03-13 DIAGNOSIS — Z79899 Other long term (current) drug therapy: Secondary | ICD-10-CM | POA: Diagnosis not present

## 2021-03-15 DIAGNOSIS — Z79899 Other long term (current) drug therapy: Secondary | ICD-10-CM | POA: Diagnosis not present

## 2021-03-15 DIAGNOSIS — M0609 Rheumatoid arthritis without rheumatoid factor, multiple sites: Secondary | ICD-10-CM | POA: Diagnosis not present

## 2021-04-11 DIAGNOSIS — M0600 Rheumatoid arthritis without rheumatoid factor, unspecified site: Secondary | ICD-10-CM | POA: Diagnosis not present

## 2021-05-15 DIAGNOSIS — M0600 Rheumatoid arthritis without rheumatoid factor, unspecified site: Secondary | ICD-10-CM | POA: Diagnosis not present

## 2021-06-03 DIAGNOSIS — J382 Nodules of vocal cords: Secondary | ICD-10-CM | POA: Diagnosis not present

## 2021-06-03 DIAGNOSIS — J383 Other diseases of vocal cords: Secondary | ICD-10-CM | POA: Diagnosis not present

## 2021-06-03 DIAGNOSIS — J387 Other diseases of larynx: Secondary | ICD-10-CM | POA: Diagnosis not present

## 2021-06-03 DIAGNOSIS — R490 Dysphonia: Secondary | ICD-10-CM | POA: Diagnosis not present

## 2021-06-03 DIAGNOSIS — J384 Edema of larynx: Secondary | ICD-10-CM | POA: Diagnosis not present

## 2021-06-03 DIAGNOSIS — M069 Rheumatoid arthritis, unspecified: Secondary | ICD-10-CM | POA: Diagnosis not present

## 2021-06-11 DIAGNOSIS — R490 Dysphonia: Secondary | ICD-10-CM | POA: Diagnosis not present

## 2021-06-12 DIAGNOSIS — M0600 Rheumatoid arthritis without rheumatoid factor, unspecified site: Secondary | ICD-10-CM | POA: Diagnosis not present

## 2021-06-18 ENCOUNTER — Encounter (INDEPENDENT_AMBULATORY_CARE_PROVIDER_SITE_OTHER): Payer: Self-pay | Admitting: Vascular Surgery

## 2021-06-18 ENCOUNTER — Ambulatory Visit (INDEPENDENT_AMBULATORY_CARE_PROVIDER_SITE_OTHER): Payer: BC Managed Care – PPO | Admitting: Vascular Surgery

## 2021-06-18 VITALS — BP 117/84 | HR 74 | Resp 16 | Wt 310.8 lb

## 2021-06-18 DIAGNOSIS — M0600 Rheumatoid arthritis without rheumatoid factor, unspecified site: Secondary | ICD-10-CM

## 2021-06-18 DIAGNOSIS — I82411 Acute embolism and thrombosis of right femoral vein: Secondary | ICD-10-CM

## 2021-06-18 MED ORDER — APIXABAN 2.5 MG PO TABS: 2.5000 mg | ORAL_TABLET | Freq: Two times a day (BID) | ORAL | Status: AC

## 2021-06-18 NOTE — Progress Notes (Signed)
MRN : [id number]  Kathleen Lindsey is a 36 y.o. (1985/04/02) female who presents with chief complaint of  Chief Complaint  Patient presents with   Follow-up    1 yr follow up  .  History of Present Illness: Patient returns today in follow up of previous DVT and lower extremity swelling.  She has occasional swelling that is mild and not dramatic at this point.  She really does not wear her compression socks very much.  No chest pain or shortness of breath.  She is tolerating 2.5 mg twice daily of Eliquis without any bleeding issues.  Current Outpatient Medications  Medication Sig Dispense Refill   acetaminophen (TYLENOL) 650 MG CR tablet Take by mouth.     albuterol (VENTOLIN HFA) 108 (90 Base) MCG/ACT inhaler Inhale 2 puffs into the lungs every 6 (six) hours as needed for wheezing or shortness of breath. 8 g 1   colchicine 0.6 MG tablet Take 0.6 mg by mouth 2 (two) times daily.     ELIQUIS 2.5 MG TABS tablet TAKE 1 TABLET BY MOUTH TWICE A DAY 60 tablet 5   ELIQUIS 5 MG TABS tablet Take 5 mg by mouth 2 (two) times daily.     ferrous sulfate 325 (65 FE) MG tablet Take 325 mg by mouth daily.     HYDROcodone-acetaminophen (NORCO/VICODIN) 5-325 MG tablet Take 1 tablet by mouth 2 (two) times daily as needed.     omeprazole (PRILOSEC) 40 MG capsule Take 40 mg by mouth as needed.     pantoprazole (PROTONIX) 40 MG tablet Take by mouth.     phentermine 37.5 MG capsule Take 37.5 mg by mouth every morning. (Patient not taking: Reported on 06/18/2021)     predniSONE (DELTASONE) 10 MG tablet Take 10 mg by mouth daily. (Patient not taking: Reported on 06/18/2021)     No current facility-administered medications for this visit.    Past Medical History:  Diagnosis Date   Arthritis    DVT (deep venous thrombosis) (HCC)     Past Surgical History:  Procedure Laterality Date   PERIPHERAL VASCULAR THROMBECTOMY Right 06/15/2019   Procedure: PERIPHERAL VASCULAR THROMBECTOMY;  Surgeon: Annice Needy, MD;  Location: [facility] INVASIVE CV LAB;  Service: Cardiovascular;  Laterality: Right;     Social History   Tobacco Use   Smoking status: Never   Smokeless tobacco: Never  Substance Use Topics   Alcohol use: Yes    Comment: rare   Drug use: Never     Family History  Problem Relation Age of Onset   Cancer Mother    Diabetes Mother      No Known Allergies  REVIEW OF SYSTEMS (Negative unless checked)   Constitutional: Weight loss  Fever  Chills Cardiac: Chest pain   Chest pressure   Palpitations   Shortness of breath when laying flat   Shortness of breath at rest   Shortness of breath with exertion. Vascular:  Pain in legs with walking   Pain in legs at rest   Pain in legs when laying flat   Claudication   Pain in feet when walking  Pain in feet at rest  Pain in feet when laying flat   History of DVT   Phlebitis   Swelling in legs   Varicose veins   Non-healing ulcers Pulmonary:   Uses home oxygen   Productive cough   Hemoptysis   Wheeze  COPD   Asthma Neurologic:  Dizziness  Blackouts     Seizures   [] History of stroke   [] History of TIA  [] Aphasia   [] Temporary blindness   [] Dysphagia   [] Weakness or numbness in arms   [] Weakness or numbness in legs Musculoskeletal:  [x] Arthritis   [] Joint swelling   [] Joint pain   [] Low back pain Hematologic:  [] Easy bruising  [] Easy bleeding   [] Hypercoagulable state   [] Anemic   Gastrointestinal:  [] Blood in stool   [] Vomiting blood  [] Gastroesophageal reflux/heartburn   [] Abdominal pain Genitourinary:  [] Chronic kidney disease   [] Difficult urination  [] Frequent urination  [] Burning with urination   [] Hematuria Skin:  [] Rashes   [] Ulcers   [] Wounds Psychological:  [] History of anxiety   []  History of major depression.  Physical Examination  BP 117/84 (BP Location: Right Arm)   Pulse 74   Resp 16   Wt (!) 310 lb 12.8 oz (141 kg)   BMI 53.35 kg/m  Gen:  WD/WN, NAD.   Obese Head: [place]/AT, No temporalis wasting. Ear/Nose/Throat: Hearing grossly intact, nares w/o erythema or drainage Eyes: Conjunctiva clear. Sclera non-icteric Neck: Supple.  Trachea midline Pulmonary:  Good air movement, no use of accessory muscles.  Cardiac: RRR, no JVD Vascular:  Vessel Right Left  Radial Palpable Palpable           Musculoskeletal: M/S 5/5 throughout.  No deformity or atrophy.  No significant lower extremity edema. Neurologic: Sensation grossly intact in extremities.  Symmetrical.  Speech is fluent.  Psychiatric: Judgment intact, Mood & affect appropriate for pt's clinical situation. Dermatologic: No rashes or ulcers noted.  No cellulitis or open wounds.      Labs No results found for this or any previous visit (from the past 2160 hour(s)).  Radiology No results found.  Assessment/Plan Seronegative rheumatoid arthritis (HCC) Not being able to take NSAIDs has been problematic as she has not been able to get her infusions and her RA has had intermittent flares.  Lowering her dose of Eliquis allowed her to take intermittent NSAIDs with a low risk of bleeding.   DVT (deep venous thrombosis) (HCC) Previous right lower extremity thrombectomy with good results.  Minimal postphlebitic symptoms.  She is at high risk of recurrence given her size and young age.  She will remain on 2.5 mg of Eliquis twice daily indefinitely.  Return to clinic in 1 year.   Festus Barren, MD  06/18/2021 9:38 AM    This note was created with Dragon medical transcription system.  Any errors from dictation are purely unintentional

## 2021-06-26 ENCOUNTER — Other Ambulatory Visit (INDEPENDENT_AMBULATORY_CARE_PROVIDER_SITE_OTHER): Payer: Self-pay | Admitting: Vascular Surgery

## 2021-07-10 DIAGNOSIS — M0600 Rheumatoid arthritis without rheumatoid factor, unspecified site: Secondary | ICD-10-CM | POA: Diagnosis not present

## 2021-07-10 DIAGNOSIS — R0781 Pleurodynia: Secondary | ICD-10-CM | POA: Diagnosis not present

## 2021-07-10 DIAGNOSIS — M25561 Pain in right knee: Secondary | ICD-10-CM | POA: Diagnosis not present

## 2021-07-10 DIAGNOSIS — M0609 Rheumatoid arthritis without rheumatoid factor, multiple sites: Secondary | ICD-10-CM | POA: Diagnosis not present

## 2021-07-10 DIAGNOSIS — Z79899 Other long term (current) drug therapy: Secondary | ICD-10-CM | POA: Diagnosis not present

## 2021-07-17 DIAGNOSIS — R490 Dysphonia: Secondary | ICD-10-CM | POA: Diagnosis not present

## 2021-07-25 DIAGNOSIS — G5783 Other specified mononeuropathies of bilateral lower limbs: Secondary | ICD-10-CM | POA: Diagnosis not present

## 2021-07-25 DIAGNOSIS — B070 Plantar wart: Secondary | ICD-10-CM | POA: Diagnosis not present

## 2021-07-29 DIAGNOSIS — M25561 Pain in right knee: Secondary | ICD-10-CM | POA: Diagnosis not present

## 2021-07-29 DIAGNOSIS — G8929 Other chronic pain: Secondary | ICD-10-CM | POA: Diagnosis not present

## 2021-07-29 DIAGNOSIS — M25562 Pain in left knee: Secondary | ICD-10-CM | POA: Diagnosis not present

## 2021-08-02 DIAGNOSIS — R490 Dysphonia: Secondary | ICD-10-CM | POA: Diagnosis not present

## 2021-08-12 DIAGNOSIS — M25562 Pain in left knee: Secondary | ICD-10-CM | POA: Diagnosis not present

## 2021-08-12 DIAGNOSIS — G8929 Other chronic pain: Secondary | ICD-10-CM | POA: Diagnosis not present

## 2021-08-12 DIAGNOSIS — M0600 Rheumatoid arthritis without rheumatoid factor, unspecified site: Secondary | ICD-10-CM | POA: Diagnosis not present

## 2021-08-12 DIAGNOSIS — M25561 Pain in right knee: Secondary | ICD-10-CM | POA: Diagnosis not present

## 2021-08-23 DIAGNOSIS — M25562 Pain in left knee: Secondary | ICD-10-CM | POA: Diagnosis not present

## 2021-08-23 DIAGNOSIS — M25561 Pain in right knee: Secondary | ICD-10-CM | POA: Diagnosis not present

## 2021-08-23 DIAGNOSIS — G8929 Other chronic pain: Secondary | ICD-10-CM | POA: Diagnosis not present

## 2021-08-29 DIAGNOSIS — M25561 Pain in right knee: Secondary | ICD-10-CM | POA: Diagnosis not present

## 2021-08-29 DIAGNOSIS — G8929 Other chronic pain: Secondary | ICD-10-CM | POA: Diagnosis not present

## 2021-08-29 DIAGNOSIS — M25562 Pain in left knee: Secondary | ICD-10-CM | POA: Diagnosis not present

## 2021-09-11 DIAGNOSIS — M25561 Pain in right knee: Secondary | ICD-10-CM | POA: Diagnosis not present

## 2021-09-11 DIAGNOSIS — M25562 Pain in left knee: Secondary | ICD-10-CM | POA: Diagnosis not present

## 2021-09-11 DIAGNOSIS — G8929 Other chronic pain: Secondary | ICD-10-CM | POA: Diagnosis not present

## 2021-09-11 DIAGNOSIS — M0600 Rheumatoid arthritis without rheumatoid factor, unspecified site: Secondary | ICD-10-CM | POA: Diagnosis not present

## 2021-09-22 IMAGING — CT CT ANGIO CHEST
2 of 6 series · 19 of 46 positions shown · IV contrast (omnipaque)
Comparison: None.

CLINICAL DATA: Shortness of breath, chest pain.

EXAM:
CT ANGIOGRAPHY CHEST WITH CONTRAST
TECHNIQUE: Multidetector CT imaging of the chest was performed using the
standard protocol during bolus administration of intravenous
contrast. Multiplanar CT image reconstructions and MIPs were
obtained to evaluate the vascular anatomy.
CONTRAST:  75mL OMNIPAQUE IOHEXOL 350 MG/ML SOLN

[Series 6: thins · axial · 0.55mm/px · z∈[-597,-387]mm · 16 of 230 slices shown]
[im 10/230  lung]
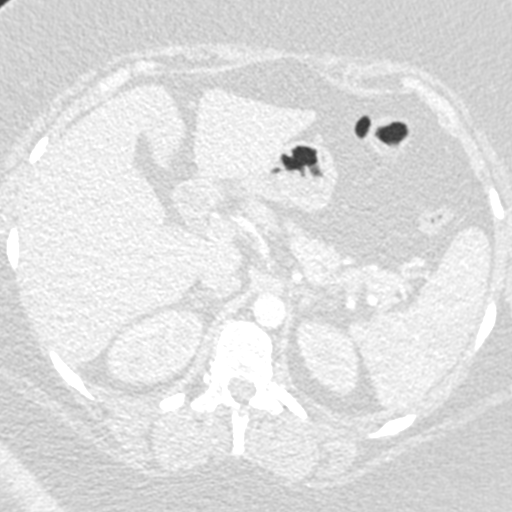
[im 30/230  soft-tissue]
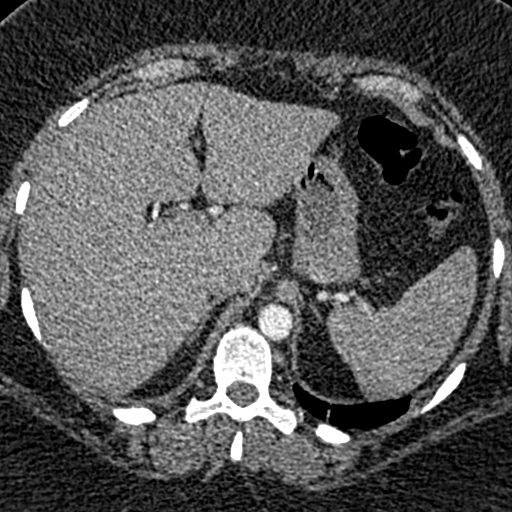
[im 40/230  lung]
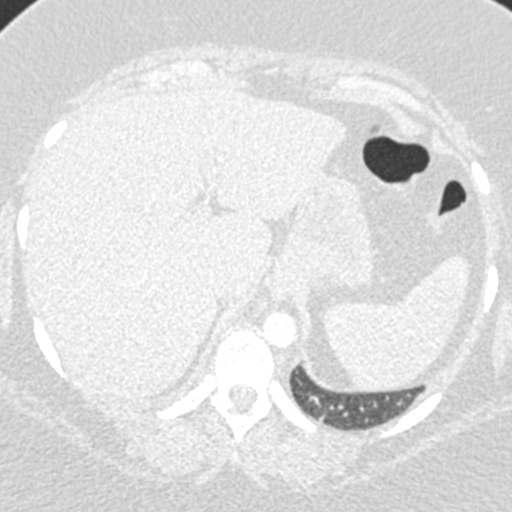
[im 50/230  soft-tissue]
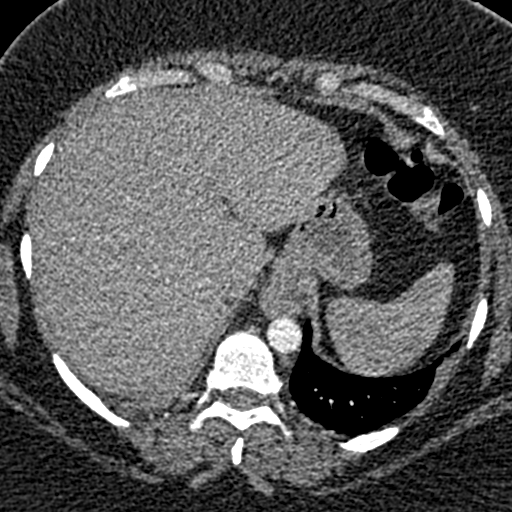
[im 70/230  lung]
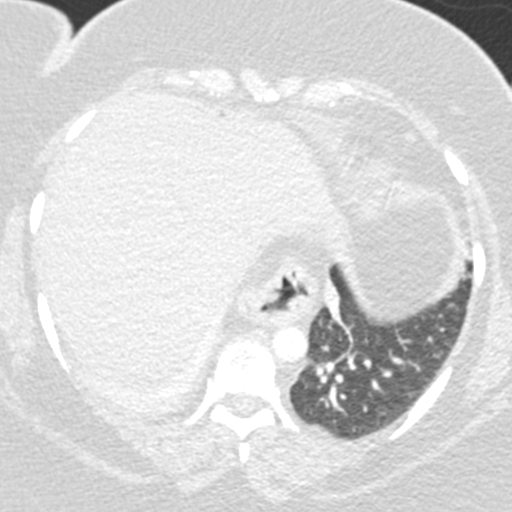
[im 80/230  soft-tissue]
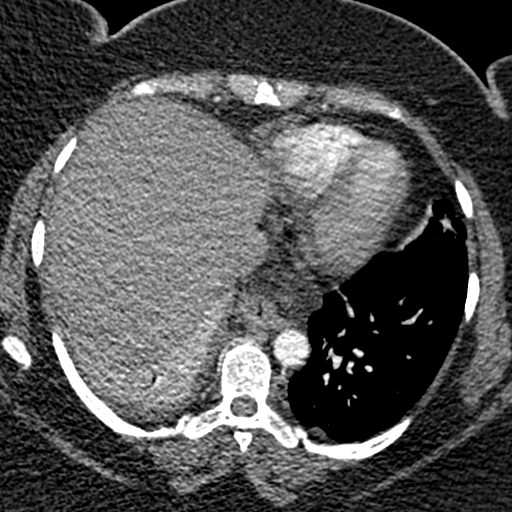
[im 90/230  lung]
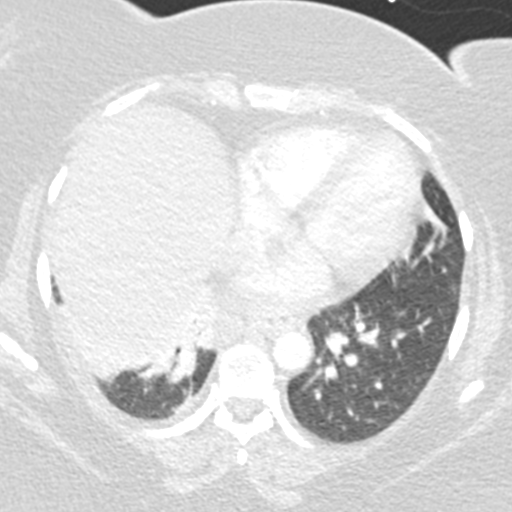
[im 110/230  soft-tissue]
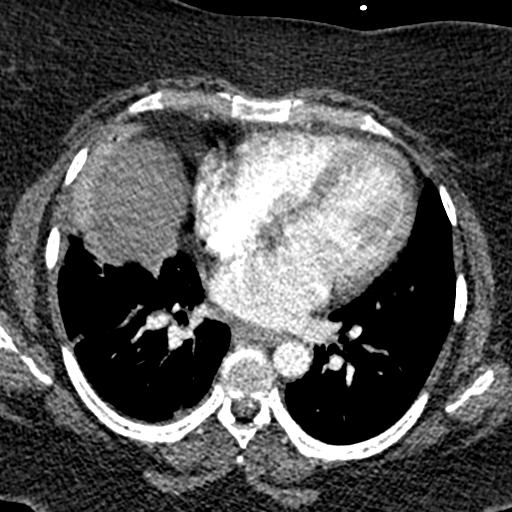
[im 120/230  lung]
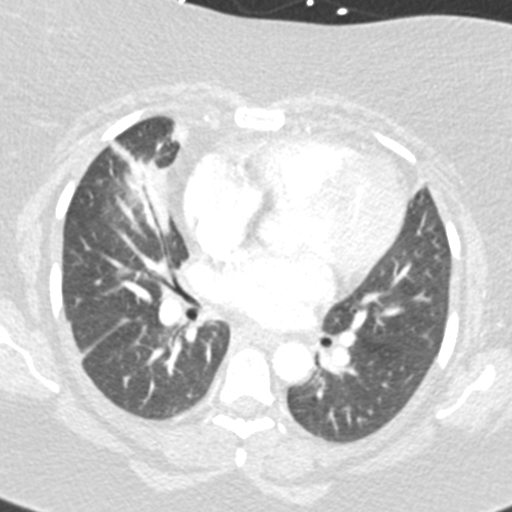
[im 140/230  soft-tissue]
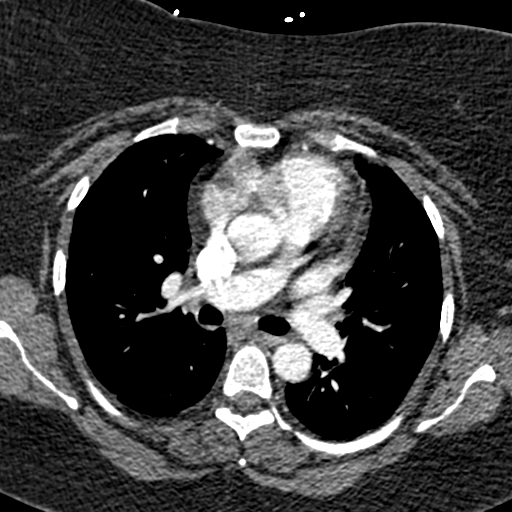
[im 150/230  lung]
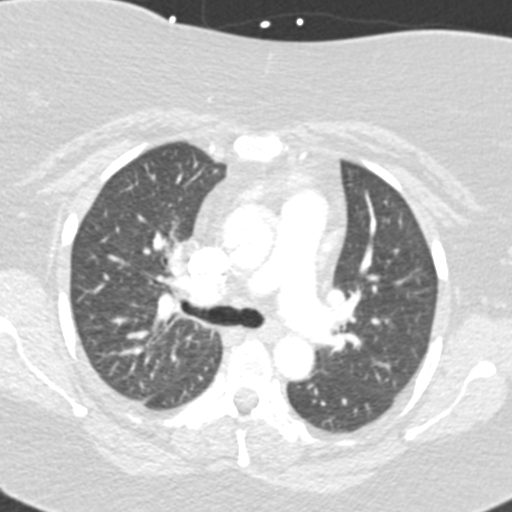
[im 160/230  soft-tissue]
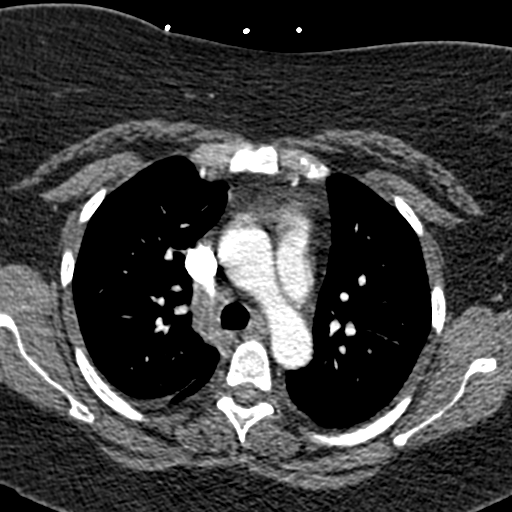
[im 180/230  lung]
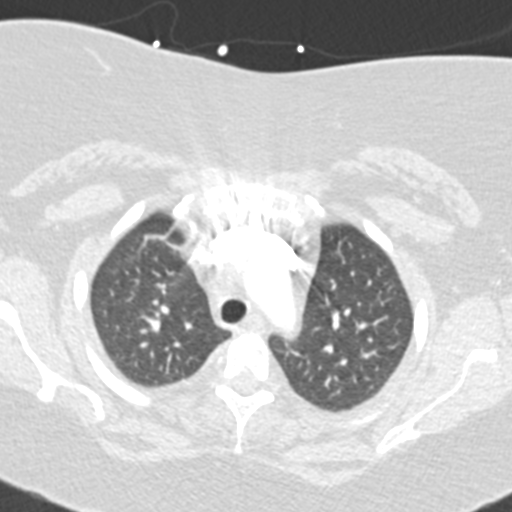
[im 190/230  soft-tissue]
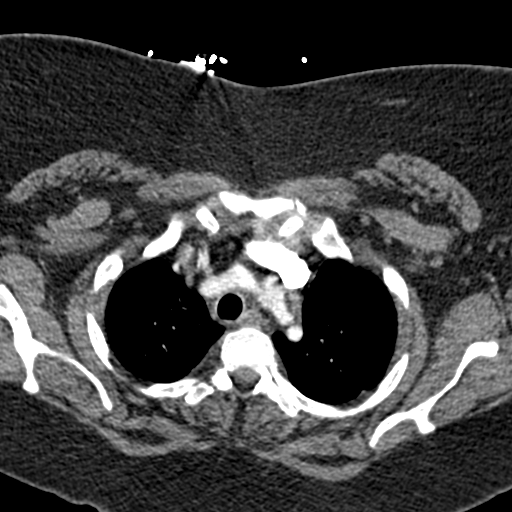
[im 200/230  lung]
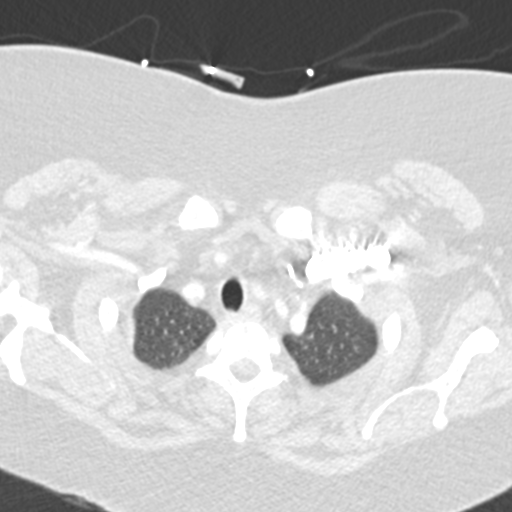
[im 220/230  soft-tissue]
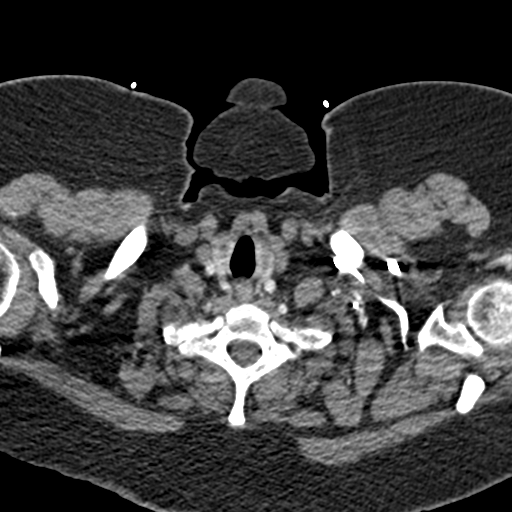

[Series 8: coronal mpr · coronal · 0.49mm/px · 3 of 106 slices shown]
[im 27/106  soft-tissue]
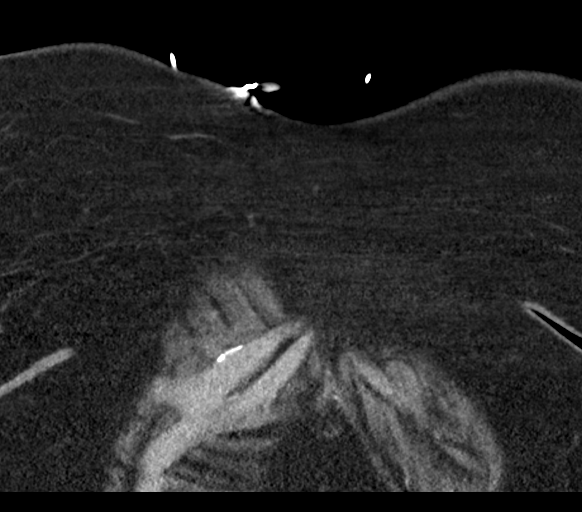
[im 53/106  soft-tissue]
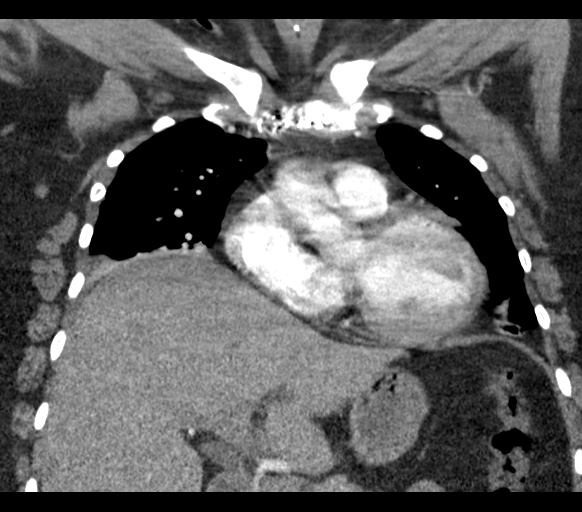
[im 79/106  soft-tissue]
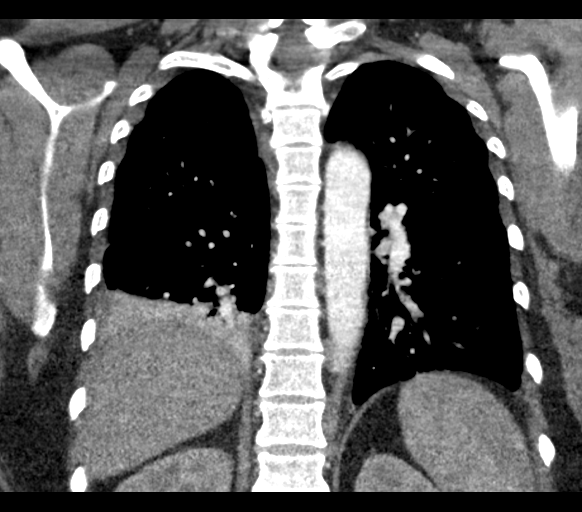

[19 of 46 positions shown; findings below may reference images not displayed]

FINDINGS: Cardiovascular: Satisfactory opacification of the pulmonary arteries
to the segmental level. No evidence of pulmonary embolism. Normal
heart size. No pericardial effusion.

Mediastinum/Nodes: Moderate size sliding-type hiatal hernia is
noted. No adenopathy is noted. Thyroid gland is unremarkable.

Lungs/Pleura: No pneumothorax or pleural effusion is noted. Left
lung is clear. Elevated right hemidiaphragm is noted with mild right
basilar subsegmental atelectasis.

Upper Abdomen: No acute abnormality.

Musculoskeletal: No chest wall abnormality. No acute or significant
osseous findings.

Review of the MIP images confirms the above findings.
IMPRESSION: 1. No definite evidence of pulmonary embolus.
2. Moderate size sliding-type hiatal hernia.
3. Elevated right hemidiaphragm is noted with mild right basilar
subsegmental atelectasis.

## 2021-09-22 IMAGING — CR DG CHEST 2V
1 series · 2 of 2 positions shown · non-contrast
Comparison: February 22, 2016.

CLINICAL DATA: Chest pain.

EXAM:
CHEST - 2 VIEW

[Series 1: dg chest 2 view · 0.14mm/px · 2 of 2 slices shown]
[im 1/2]
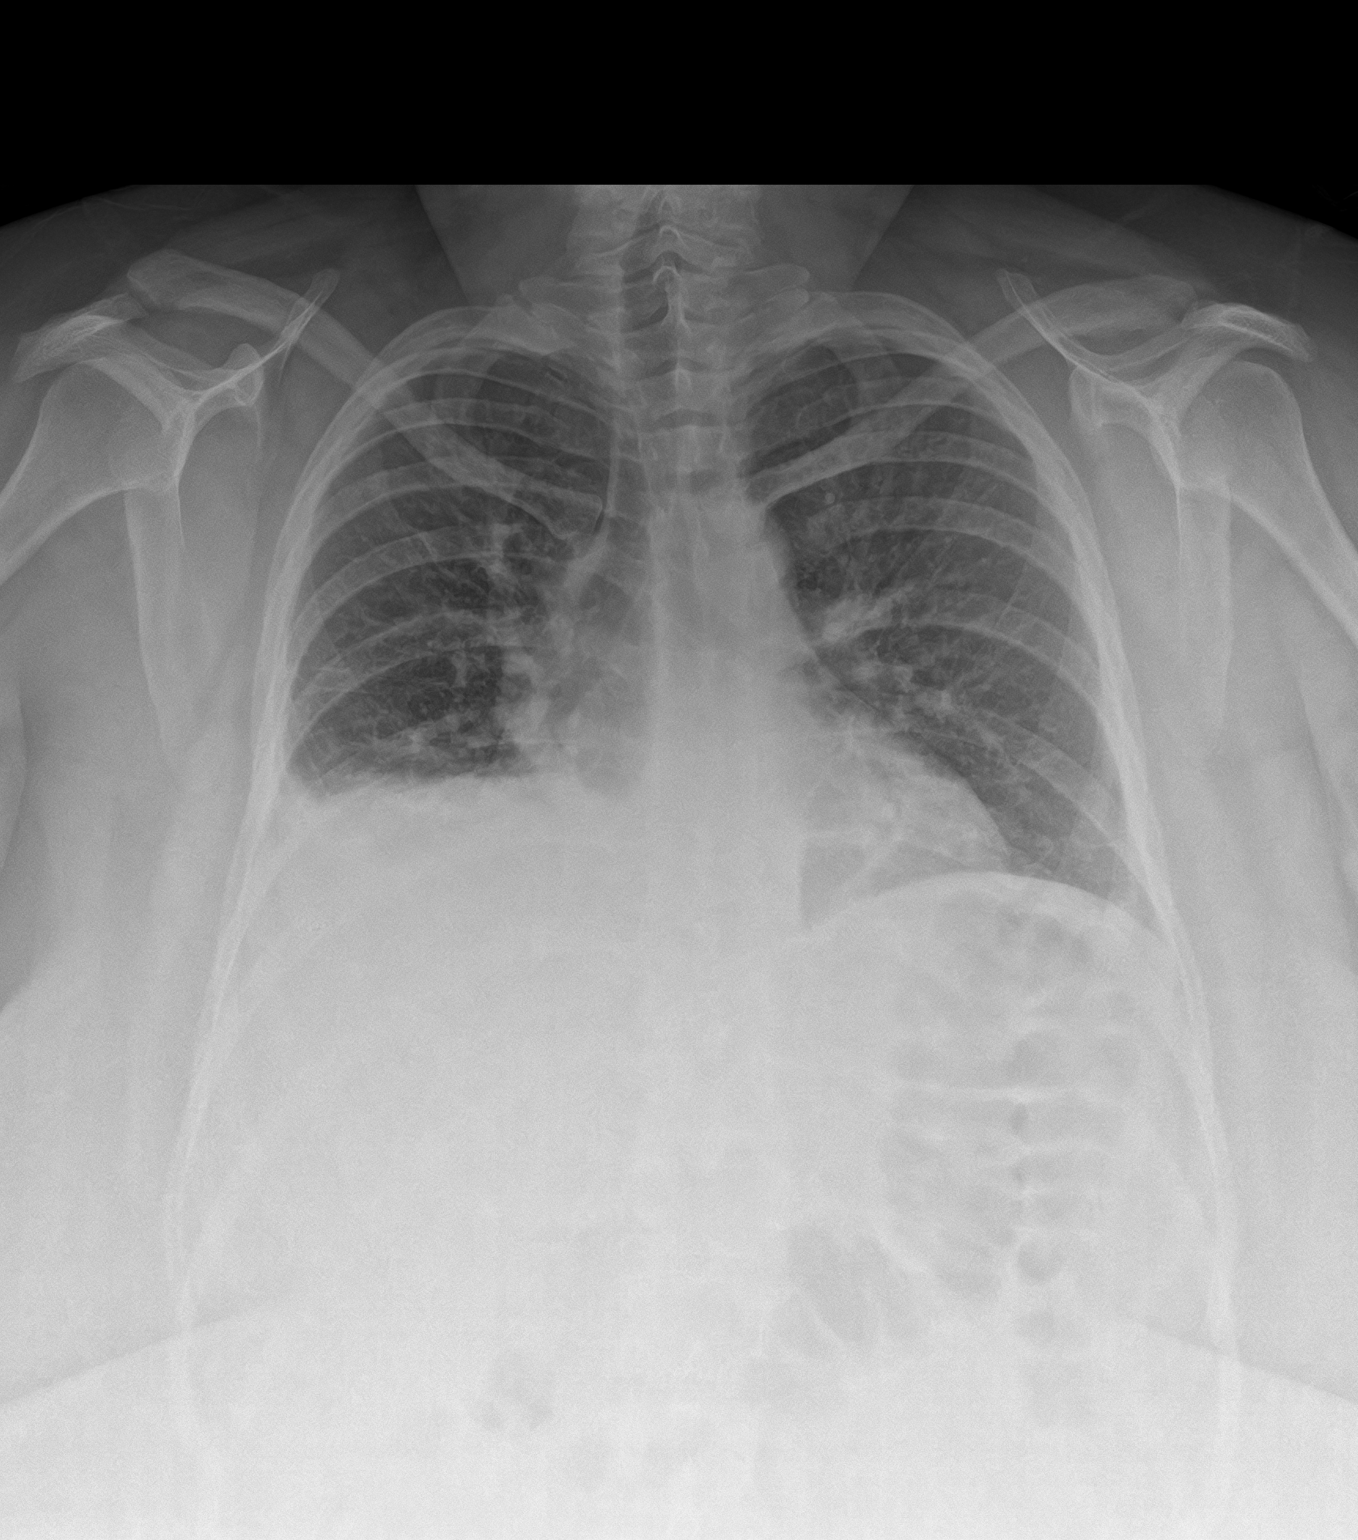
[im 2/2]
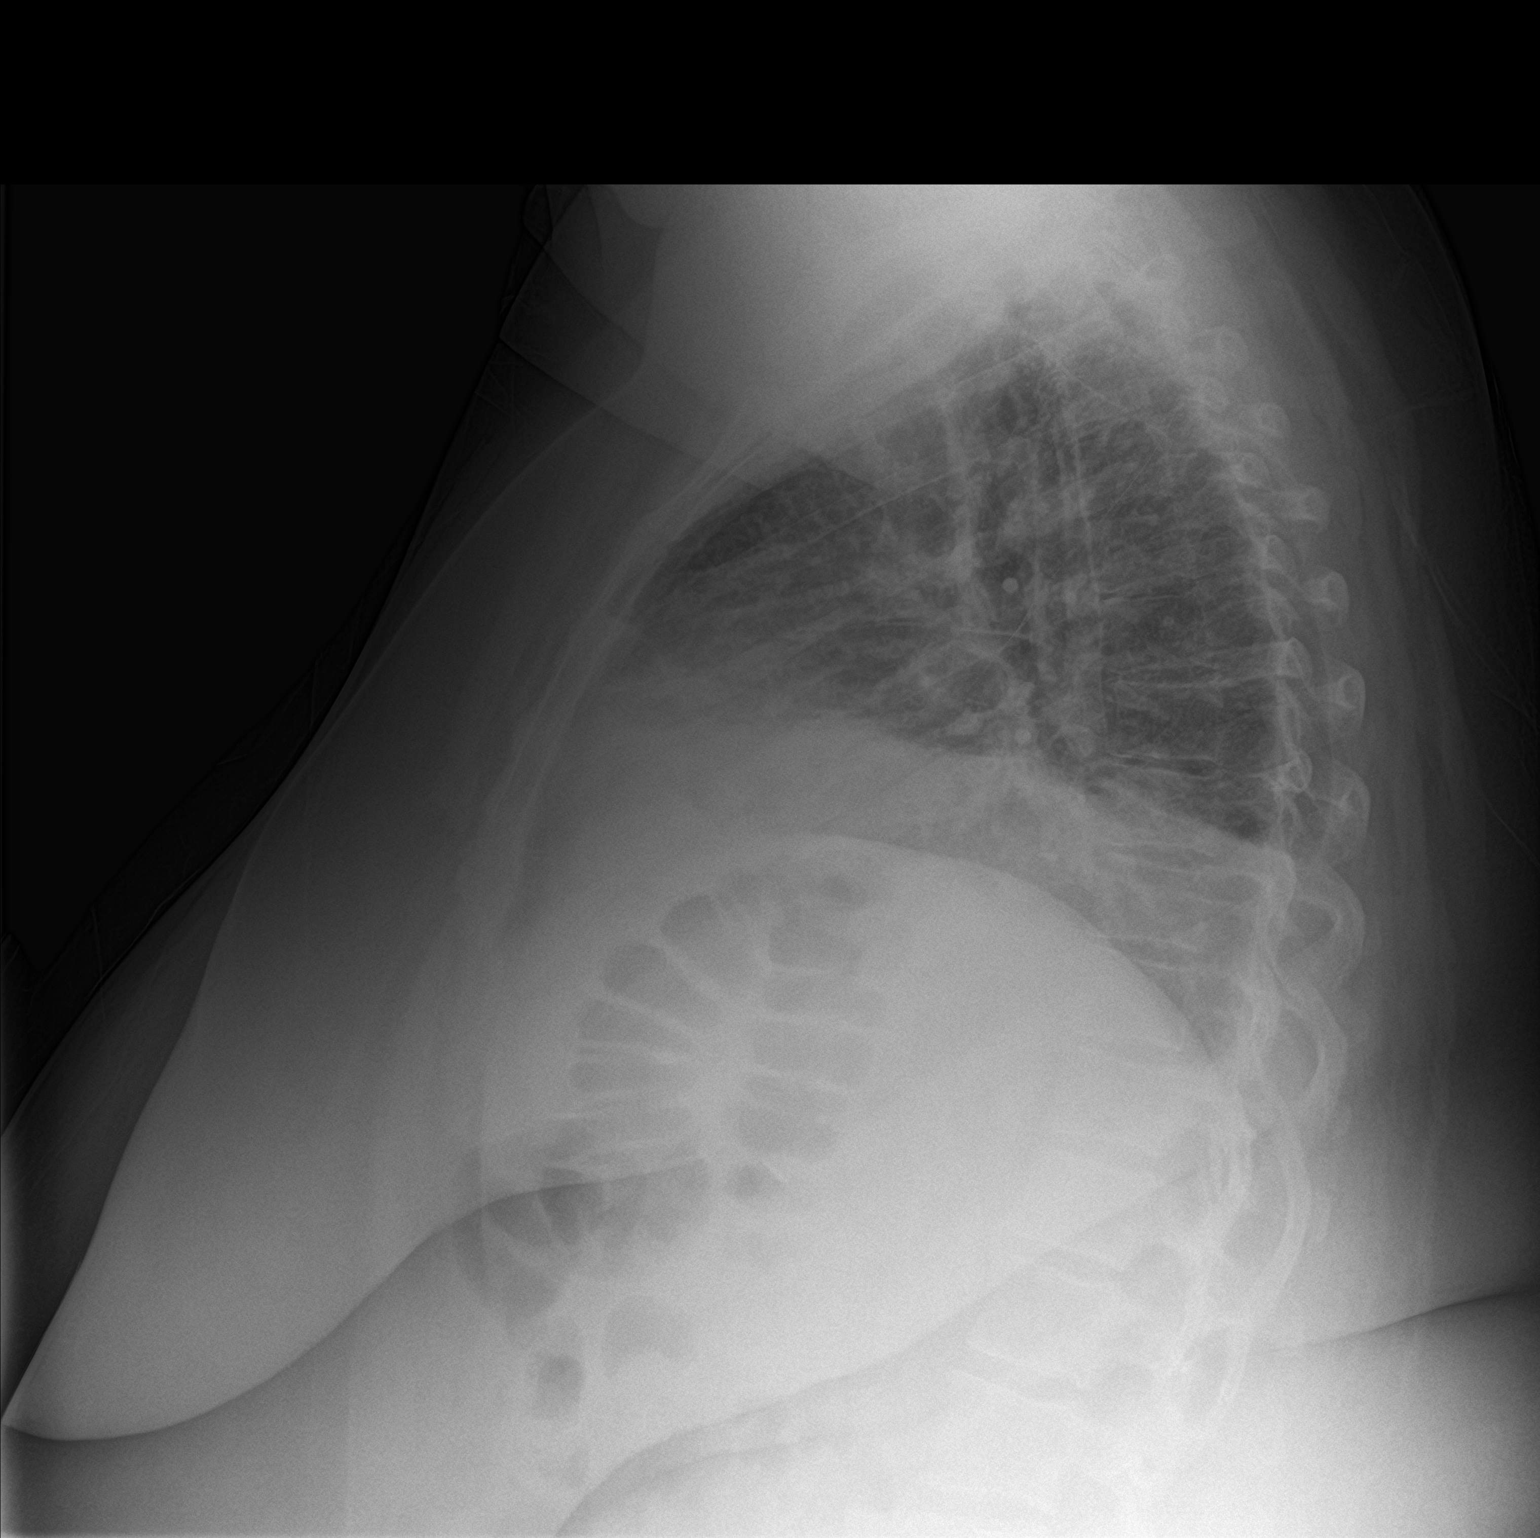

[2 of 2 positions shown; findings below may reference images not displayed]

FINDINGS: The heart size and mediastinal contours are within normal limits. No
pneumothorax is noted. Left lung is clear. Mild right pleural
effusion is noted with adjacent subsegmental atelectasis or
infiltrate. The visualized skeletal structures are unremarkable.
IMPRESSION: Mild right pleural effusion is noted with adjacent subsegmental
atelectasis or infiltrate.

## 2021-09-25 DIAGNOSIS — M25562 Pain in left knee: Secondary | ICD-10-CM | POA: Diagnosis not present

## 2021-09-25 DIAGNOSIS — G8929 Other chronic pain: Secondary | ICD-10-CM | POA: Diagnosis not present

## 2021-09-25 DIAGNOSIS — M25561 Pain in right knee: Secondary | ICD-10-CM | POA: Diagnosis not present

## 2021-10-09 DIAGNOSIS — M25562 Pain in left knee: Secondary | ICD-10-CM | POA: Diagnosis not present

## 2021-10-09 DIAGNOSIS — M0600 Rheumatoid arthritis without rheumatoid factor, unspecified site: Secondary | ICD-10-CM | POA: Diagnosis not present

## 2021-10-09 DIAGNOSIS — G8929 Other chronic pain: Secondary | ICD-10-CM | POA: Diagnosis not present

## 2021-10-09 DIAGNOSIS — M25561 Pain in right knee: Secondary | ICD-10-CM | POA: Diagnosis not present

## 2021-10-30 DIAGNOSIS — M25562 Pain in left knee: Secondary | ICD-10-CM | POA: Diagnosis not present

## 2021-10-30 DIAGNOSIS — M25561 Pain in right knee: Secondary | ICD-10-CM | POA: Diagnosis not present

## 2021-10-30 DIAGNOSIS — G8929 Other chronic pain: Secondary | ICD-10-CM | POA: Diagnosis not present

## 2021-11-06 DIAGNOSIS — M0600 Rheumatoid arthritis without rheumatoid factor, unspecified site: Secondary | ICD-10-CM | POA: Diagnosis not present

## 2021-11-11 DIAGNOSIS — M0609 Rheumatoid arthritis without rheumatoid factor, multiple sites: Secondary | ICD-10-CM | POA: Diagnosis not present

## 2021-11-11 DIAGNOSIS — Z79899 Other long term (current) drug therapy: Secondary | ICD-10-CM | POA: Diagnosis not present

## 2021-11-22 DIAGNOSIS — M25561 Pain in right knee: Secondary | ICD-10-CM | POA: Diagnosis not present

## 2021-11-22 DIAGNOSIS — M25562 Pain in left knee: Secondary | ICD-10-CM | POA: Diagnosis not present

## 2021-11-22 DIAGNOSIS — G8929 Other chronic pain: Secondary | ICD-10-CM | POA: Diagnosis not present

## 2021-11-25 ENCOUNTER — Encounter (INDEPENDENT_AMBULATORY_CARE_PROVIDER_SITE_OTHER): Payer: Self-pay

## 2021-12-04 DIAGNOSIS — M0600 Rheumatoid arthritis without rheumatoid factor, unspecified site: Secondary | ICD-10-CM | POA: Diagnosis not present

## 2022-01-07 DIAGNOSIS — M0600 Rheumatoid arthritis without rheumatoid factor, unspecified site: Secondary | ICD-10-CM | POA: Diagnosis not present

## 2022-01-16 DIAGNOSIS — J101 Influenza due to other identified influenza virus with other respiratory manifestations: Secondary | ICD-10-CM | POA: Diagnosis not present

## 2022-01-17 ENCOUNTER — Other Ambulatory Visit (INDEPENDENT_AMBULATORY_CARE_PROVIDER_SITE_OTHER): Payer: Self-pay | Admitting: Vascular Surgery

## 2022-02-04 DIAGNOSIS — M0600 Rheumatoid arthritis without rheumatoid factor, unspecified site: Secondary | ICD-10-CM | POA: Diagnosis not present

## 2022-03-11 DIAGNOSIS — M0600 Rheumatoid arthritis without rheumatoid factor, unspecified site: Secondary | ICD-10-CM | POA: Diagnosis not present

## 2022-04-15 DIAGNOSIS — M0600 Rheumatoid arthritis without rheumatoid factor, unspecified site: Secondary | ICD-10-CM | POA: Diagnosis not present

## 2022-04-18 DIAGNOSIS — Z79899 Other long term (current) drug therapy: Secondary | ICD-10-CM | POA: Diagnosis not present

## 2022-04-18 DIAGNOSIS — B029 Zoster without complications: Secondary | ICD-10-CM | POA: Diagnosis not present

## 2022-04-18 DIAGNOSIS — D84821 Immunodeficiency due to drugs: Secondary | ICD-10-CM | POA: Diagnosis not present

## 2022-05-13 DIAGNOSIS — M0600 Rheumatoid arthritis without rheumatoid factor, unspecified site: Secondary | ICD-10-CM | POA: Diagnosis not present

## 2022-05-13 DIAGNOSIS — R0781 Pleurodynia: Secondary | ICD-10-CM | POA: Diagnosis not present

## 2022-05-13 DIAGNOSIS — M0609 Rheumatoid arthritis without rheumatoid factor, multiple sites: Secondary | ICD-10-CM | POA: Diagnosis not present

## 2022-05-13 DIAGNOSIS — Z79899 Other long term (current) drug therapy: Secondary | ICD-10-CM | POA: Diagnosis not present

## 2022-06-10 DIAGNOSIS — M0600 Rheumatoid arthritis without rheumatoid factor, unspecified site: Secondary | ICD-10-CM | POA: Diagnosis not present

## 2022-06-20 ENCOUNTER — Encounter (INDEPENDENT_AMBULATORY_CARE_PROVIDER_SITE_OTHER): Payer: Self-pay | Admitting: Vascular Surgery

## 2022-06-20 ENCOUNTER — Ambulatory Visit (INDEPENDENT_AMBULATORY_CARE_PROVIDER_SITE_OTHER): Payer: BC Managed Care – PPO | Admitting: Vascular Surgery

## 2022-06-20 VITALS — BP 128/90 | HR 88 | Resp 18 | Ht 63.5 in | Wt 319.6 lb

## 2022-06-20 DIAGNOSIS — I82411 Acute embolism and thrombosis of right femoral vein: Secondary | ICD-10-CM

## 2022-06-20 DIAGNOSIS — D692 Other nonthrombocytopenic purpura: Secondary | ICD-10-CM | POA: Diagnosis not present

## 2022-06-20 DIAGNOSIS — M0600 Rheumatoid arthritis without rheumatoid factor, unspecified site: Secondary | ICD-10-CM | POA: Diagnosis not present

## 2022-06-20 MED ORDER — APIXABAN 2.5 MG PO TABS: 2.5000 mg | ORAL_TABLET | Freq: Two times a day (BID) | ORAL | 11 refills | Status: DC

## 2022-06-20 NOTE — Assessment & Plan Note (Signed)
Patient has what appears to be a purpuric rash on the lower extremities.  I am going to refer her to dermatology for further evaluation.  She requested [facility] so we will try to get an appointment made with them.

## 2022-06-20 NOTE — Progress Notes (Signed)
MRN : [id number]  Kathleen Lindsey is a 37 y.o. (May 19, 1985) female who presents with chief complaint of  Chief Complaint  Patient presents with   Follow-up    39yr. no studies.  .  History of Present Illness: Patient returns today in follow up of her previous DVT, leg swelling.  Her leg swelling is under good control currently.  She really has no appreciable edema today and says she occasionally gets very mild swelling.  Her biggest issue over the past several months has been an area on her lower legs that looks like a rash.  Initially, they had some issues with a flea infestation with her animals in her home and there was suspicion that this was related to flea bites.  That has been taking care of, but the rash has been getting progressively worse.  It affects both legs and is now up around the knees and throughout the lower portion of the legs.  She has seen her rheumatologist.  She has pretty severe rheumatoid arthritis.  Current Outpatient Medications  Medication Sig Dispense Refill   acetaminophen (TYLENOL) 650 MG CR tablet Take by mouth.     colchicine 0.6 MG tablet Take 0.6 mg by mouth 2 (two) times daily.     ELIQUIS 2.5 MG TABS tablet TAKE 1 TABLET BY MOUTH TWICE A DAY 60 tablet 5   HYDROcodone-acetaminophen (NORCO/VICODIN) 5-325 MG tablet Take 1 tablet by mouth as needed.     hydroxychloroquine (PLAQUENIL) 200 MG tablet Take 200 mg by mouth 2 (two) times daily.     omeprazole (PRILOSEC) 40 MG capsule Take 40 mg by mouth as needed.     oseltamivir (TAMIFLU) 75 MG capsule Take 75 mg by mouth 2 (two) times daily.     pantoprazole (PROTONIX) 40 MG tablet Take by mouth.     albuterol (VENTOLIN HFA) 108 (90 Base) MCG/ACT inhaler Inhale 2 puffs into the lungs every 6 (six) hours as needed for wheezing or shortness of breath. (Patient not taking: Reported on 06/20/2022) 8 g 1   ELIQUIS 5 MG TABS tablet Take 5 mg by mouth 2 (two) times daily. (Patient not taking: Reported on  06/20/2022)     ferrous sulfate 325 (65 FE) MG tablet Take 325 mg by mouth daily. (Patient not taking: Reported on 06/20/2022)     phentermine 37.5 MG capsule Take 37.5 mg by mouth every morning. (Patient not taking: Reported on 06/18/2021)     predniSONE (DELTASONE) 10 MG tablet Take 10 mg by mouth daily. (Patient not taking: Reported on 06/18/2021)     Current Facility-Administered Medications  Medication Dose Route Frequency Provider Last Rate Last Admin   apixaban (ELIQUIS) tablet 2.5 mg  2.5 mg Oral BID Annice Needy, MD        Past Medical History:  Diagnosis Date   Arthritis    DVT (deep venous thrombosis) [facility])     Past Surgical History:  Procedure Laterality Date   PERIPHERAL VASCULAR THROMBECTOMY Right 06/15/2019   Procedure: PERIPHERAL VASCULAR THROMBECTOMY;  Surgeon: Annice Needy, MD;  Location: [facility] INVASIVE CV LAB;  Service: Cardiovascular;  Laterality: Right;     Social History   Tobacco Use   Smoking status: Never   Smokeless tobacco: Never  Substance Use Topics   Alcohol use: Yes    Comment: rare   Drug use: Never       Family History  Problem Relation Age of Onset   Cancer Mother    Diabetes  Mother      No Known Allergies  REVIEW OF SYSTEMS (Negative unless checked)   Constitutional: [] Weight loss  [] Fever  [] Chills Cardiac: [] Chest pain   [] Chest pressure   [] Palpitations   [] Shortness of breath when laying flat   [] Shortness of breath at rest   [] Shortness of breath with exertion. Vascular:  [] Pain in legs with walking   [] Pain in legs at rest   [] Pain in legs when laying flat   [] Claudication   [] Pain in feet when walking  [] Pain in feet at rest  [] Pain in feet when laying flat   [x] History of DVT   [x] Phlebitis   [x] Swelling in legs   [] Varicose veins   [] Non-healing ulcers Pulmonary:   [] Uses home oxygen   [] Productive cough   [] Hemoptysis   [] Wheeze  [] COPD   [] Asthma Neurologic:  [] Dizziness  [] Blackouts   [] Seizures   [] History of stroke    [] History of TIA  [] Aphasia   [] Temporary blindness   [] Dysphagia   [] Weakness or numbness in arms   [] Weakness or numbness in legs Musculoskeletal:  [x] Arthritis   [] Joint swelling   [] Joint pain   [] Low back pain Hematologic:  [] Easy bruising  [] Easy bleeding   [] Hypercoagulable state   [] Anemic   Gastrointestinal:  [] Blood in stool   [] Vomiting blood  [] Gastroesophageal reflux/heartburn   [] Abdominal pain Genitourinary:  [] Chronic kidney disease   [] Difficult urination  [] Frequent urination  [] Burning with urination   [] Hematuria Skin:  [] Rashes   [] Ulcers   [] Wounds Psychological:  [] History of anxiety   []  History of major depression.  Physical Examination  BP (!) 128/90 (BP Location: Left Wrist)   Pulse 88   Resp 18   Ht 5' 3.5" (1.613 m)   Wt (!) 319 lb 9.6 oz (145 kg)   BMI 55.73 kg/m  Gen:  WD/WN, NAD.  Obese Head: [place]/AT, No temporalis wasting. Ear/Nose/Throat: Hearing grossly intact, nares w/o erythema or drainage Eyes: Conjunctiva clear. Sclera non-icteric Neck: Supple.  Trachea midline Pulmonary:  Good air movement, no use of accessory muscles.  Cardiac: RRR, no JVD Vascular:  Vessel Right Left  Radial Palpable Palpable                          PT Palpable Palpable  DP Palpable Palpable   Gastrointestinal: soft, non-tender/non-distended. No guarding/reflex.  Musculoskeletal: M/S 5/5 throughout.  No deformity or atrophy.  No significant lower extremity edema. Neurologic: Sensation grossly intact in extremities.  Symmetrical.  Speech is fluent.  Psychiatric: Judgment intact, Mood & affect appropriate for pt's clinical situation. Dermatologic: Purpuric rash throughout both lower extremities and lower leg.      Labs No results found for this or any previous visit (from the past 2160 hour(s)).  Radiology No results found.  Assessment/Plan Seronegative rheumatoid arthritis (HCC) Not being able to take NSAIDs has been problematic as she has not been able to  get her infusions and her RA has had intermittent flares.  Lowering her dose of Eliquis allowed her to take intermittent NSAIDs with a low risk of bleeding.  [facility]) Patient has what appears to be a purpuric rash on the lower extremities.  I am going to refer her to dermatology for further evaluation.  She requested [facility] so we will try to get an appointment made with them.  DVT (deep venous thrombosis) (HCC) She is doing well.  No recurrent DVT symptoms.  She is continuing on Eliquis  2.5 mg and will refill that prescription today.  Follow-up in 1 year.    Festus Barren, MD  06/20/2022 9:56 AM    This note was created with Dragon medical transcription system.  Any errors from dictation are purely unintentional

## 2022-06-20 NOTE — Assessment & Plan Note (Signed)
She is doing well.  No recurrent DVT symptoms.  She is continuing on Eliquis 2.5 mg and will refill that prescription today.  Follow-up in 1 year.

## 2022-07-09 DIAGNOSIS — M0600 Rheumatoid arthritis without rheumatoid factor, unspecified site: Secondary | ICD-10-CM | POA: Diagnosis not present

## 2022-07-29 DIAGNOSIS — I8310 Varicose veins of unspecified lower extremity with inflammation: Secondary | ICD-10-CM | POA: Diagnosis not present

## 2022-07-29 DIAGNOSIS — L309 Dermatitis, unspecified: Secondary | ICD-10-CM | POA: Diagnosis not present

## 2022-07-29 DIAGNOSIS — D485 Neoplasm of uncertain behavior of skin: Secondary | ICD-10-CM | POA: Diagnosis not present

## 2022-08-06 DIAGNOSIS — M0600 Rheumatoid arthritis without rheumatoid factor, unspecified site: Secondary | ICD-10-CM | POA: Diagnosis not present

## 2022-09-03 DIAGNOSIS — Z79899 Other long term (current) drug therapy: Secondary | ICD-10-CM | POA: Diagnosis not present

## 2022-09-03 DIAGNOSIS — M0600 Rheumatoid arthritis without rheumatoid factor, unspecified site: Secondary | ICD-10-CM | POA: Diagnosis not present

## 2022-10-02 DIAGNOSIS — M0600 Rheumatoid arthritis without rheumatoid factor, unspecified site: Secondary | ICD-10-CM | POA: Diagnosis not present

## 2022-10-03 DIAGNOSIS — K219 Gastro-esophageal reflux disease without esophagitis: Secondary | ICD-10-CM | POA: Diagnosis not present

## 2022-10-03 DIAGNOSIS — R490 Dysphonia: Secondary | ICD-10-CM | POA: Diagnosis not present

## 2022-10-10 DIAGNOSIS — Z79899 Other long term (current) drug therapy: Secondary | ICD-10-CM | POA: Diagnosis not present

## 2022-10-16 DIAGNOSIS — M069 Rheumatoid arthritis, unspecified: Secondary | ICD-10-CM | POA: Diagnosis not present

## 2022-10-16 DIAGNOSIS — R208 Other disturbances of skin sensation: Secondary | ICD-10-CM | POA: Diagnosis not present

## 2022-10-16 DIAGNOSIS — M0609 Rheumatoid arthritis without rheumatoid factor, multiple sites: Secondary | ICD-10-CM | POA: Diagnosis not present

## 2022-10-16 DIAGNOSIS — Z79899 Other long term (current) drug therapy: Secondary | ICD-10-CM | POA: Diagnosis not present

## 2022-10-30 DIAGNOSIS — M0609 Rheumatoid arthritis without rheumatoid factor, multiple sites: Secondary | ICD-10-CM | POA: Diagnosis not present

## 2022-10-30 DIAGNOSIS — M0600 Rheumatoid arthritis without rheumatoid factor, unspecified site: Secondary | ICD-10-CM | POA: Diagnosis not present

## 2022-11-03 DIAGNOSIS — L728 Other follicular cysts of the skin and subcutaneous tissue: Secondary | ICD-10-CM | POA: Diagnosis not present

## 2022-11-03 DIAGNOSIS — I872 Venous insufficiency (chronic) (peripheral): Secondary | ICD-10-CM | POA: Diagnosis not present

## 2022-11-03 DIAGNOSIS — L958 Other vasculitis limited to the skin: Secondary | ICD-10-CM | POA: Diagnosis not present

## 2022-11-27 DIAGNOSIS — M0600 Rheumatoid arthritis without rheumatoid factor, unspecified site: Secondary | ICD-10-CM | POA: Diagnosis not present

## 2022-12-30 DIAGNOSIS — M0600 Rheumatoid arthritis without rheumatoid factor, unspecified site: Secondary | ICD-10-CM | POA: Diagnosis not present

## 2023-01-13 DIAGNOSIS — Z79899 Other long term (current) drug therapy: Secondary | ICD-10-CM | POA: Diagnosis not present

## 2023-01-13 DIAGNOSIS — R0781 Pleurodynia: Secondary | ICD-10-CM | POA: Diagnosis not present

## 2023-01-13 DIAGNOSIS — M0609 Rheumatoid arthritis without rheumatoid factor, multiple sites: Secondary | ICD-10-CM | POA: Diagnosis not present

## 2023-01-27 DIAGNOSIS — M0600 Rheumatoid arthritis without rheumatoid factor, unspecified site: Secondary | ICD-10-CM | POA: Diagnosis not present

## 2023-02-24 DIAGNOSIS — M0600 Rheumatoid arthritis without rheumatoid factor, unspecified site: Secondary | ICD-10-CM | POA: Diagnosis not present

## 2023-03-25 DIAGNOSIS — M0600 Rheumatoid arthritis without rheumatoid factor, unspecified site: Secondary | ICD-10-CM | POA: Diagnosis not present

## 2023-04-13 DIAGNOSIS — Z79899 Other long term (current) drug therapy: Secondary | ICD-10-CM | POA: Diagnosis not present

## 2023-04-13 DIAGNOSIS — M0609 Rheumatoid arthritis without rheumatoid factor, multiple sites: Secondary | ICD-10-CM | POA: Diagnosis not present

## 2023-05-20 DIAGNOSIS — M0600 Rheumatoid arthritis without rheumatoid factor, unspecified site: Secondary | ICD-10-CM | POA: Diagnosis not present

## 2023-06-16 ENCOUNTER — Encounter (INDEPENDENT_AMBULATORY_CARE_PROVIDER_SITE_OTHER): Payer: Self-pay

## 2023-06-18 DIAGNOSIS — M0600 Rheumatoid arthritis without rheumatoid factor, unspecified site: Secondary | ICD-10-CM | POA: Diagnosis not present

## 2023-06-19 ENCOUNTER — Ambulatory Visit (INDEPENDENT_AMBULATORY_CARE_PROVIDER_SITE_OTHER): Payer: BC Managed Care – PPO | Admitting: Vascular Surgery

## 2023-06-27 ENCOUNTER — Other Ambulatory Visit (INDEPENDENT_AMBULATORY_CARE_PROVIDER_SITE_OTHER): Payer: Self-pay | Admitting: Vascular Surgery

## 2023-06-29 ENCOUNTER — Other Ambulatory Visit (INDEPENDENT_AMBULATORY_CARE_PROVIDER_SITE_OTHER): Payer: Self-pay | Admitting: Nurse Practitioner

## 2023-06-29 MED ORDER — APIXABAN 2.5 MG PO TABS: 2.5000 mg | ORAL_TABLET | Freq: Two times a day (BID) | ORAL | 11 refills | Status: DC

## 2023-06-30 ENCOUNTER — Ambulatory Visit: Payer: Self-pay | Admitting: Dermatology

## 2023-07-14 ENCOUNTER — Ambulatory Visit (INDEPENDENT_AMBULATORY_CARE_PROVIDER_SITE_OTHER): Admitting: Vascular Surgery

## 2023-07-14 ENCOUNTER — Encounter (INDEPENDENT_AMBULATORY_CARE_PROVIDER_SITE_OTHER): Payer: Self-pay | Admitting: Vascular Surgery

## 2023-07-14 VITALS — BP 143/84 | HR 66 | Resp 18 | Wt 328.0 lb

## 2023-07-14 DIAGNOSIS — M0600 Rheumatoid arthritis without rheumatoid factor, unspecified site: Secondary | ICD-10-CM | POA: Diagnosis not present

## 2023-07-14 DIAGNOSIS — I82411 Acute embolism and thrombosis of right femoral vein: Secondary | ICD-10-CM | POA: Diagnosis not present

## 2023-07-14 MED ORDER — APIXABAN 2.5 MG PO TABS: 2.5000 mg | ORAL_TABLET | Freq: Two times a day (BID) | ORAL | 11 refills | Status: AC

## 2023-07-14 NOTE — Assessment & Plan Note (Signed)
 Previous history of unprovoked DVT and on rheumatoid arthritis medications and with her large size would be at risk of recurrent DVT.  We are going to continue the prophylactic dose of Eliquis  indefinitely.  New prescription for 2.5 mg twice daily of Eliquis  sent in today.

## 2023-07-14 NOTE — Progress Notes (Signed)
 MRN : [id number]  Kathleen Lindsey is a 38 y.o. (09/15/1985) female who presents with chief complaint of  Chief Complaint  Patient presents with   Follow-up    47yr follow up  .  History of Present Illness: Patient returns today in follow up of previous DVT.  Her legs are doing really well.  No current swelling or other major issues.  She denies any severe pain or ulceration.  Has tolerated 2.5 mg twice daily of Eliquis .  Given her size and other medications on board for her rheumatoid arthritis, given the fact she had a previous unprovoked DVT we were planning on continuing prophylactic Eliquis  indefinitely.  Current Outpatient Medications  Medication Sig Dispense Refill   ABATACEPT  IV Inject into the vein every 28 (twenty-eight) days.     acetaminophen (TYLENOL) 650 MG CR tablet Take by mouth.     apixaban  (ELIQUIS ) 2.5 MG TABS tablet Take 1 tablet (2.5 mg total) by mouth 2 (two) times daily. 60 tablet 11   colchicine 0.6 MG tablet Take 0.6 mg by mouth 2 (two) times daily.     ELIQUIS  2.5 MG TABS tablet TAKE 1 TABLET BY MOUTH TWICE A DAY 60 tablet 5   HYDROcodone-acetaminophen (NORCO/VICODIN) 5-325 MG tablet Take 1 tablet by mouth as needed.     hydroxychloroquine (PLAQUENIL) 200 MG tablet Take 200 mg by mouth 2 (two) times daily.     omeprazole (PRILOSEC) 40 MG capsule Take 40 mg by mouth as needed.     pantoprazole (PROTONIX) 40 MG tablet Take by mouth.     albuterol  (VENTOLIN  HFA) 108 (90 Base) MCG/ACT inhaler Inhale 2 puffs into the lungs every 6 (six) hours as needed for wheezing or shortness of breath. (Patient not taking: Reported on 07/14/2023) 8 g 1   ferrous sulfate 325 (65 FE) MG tablet Take 325 mg by mouth daily. (Patient not taking: Reported on 07/14/2023)     oseltamivir (TAMIFLU) 75 MG capsule Take 75 mg by mouth 2 (two) times daily. (Patient not taking: Reported on 07/14/2023)     phentermine 37.5 MG capsule Take 37.5 mg by mouth every morning. (Patient not  taking: Reported on 07/14/2023)     predniSONE (DELTASONE) 10 MG tablet Take 10 mg by mouth daily. (Patient not taking: Reported on 07/14/2023)     Current Facility-Administered Medications  Medication Dose Route Frequency Provider Last Rate Last Admin   apixaban  (ELIQUIS ) tablet 2.5 mg  2.5 mg Oral BID [employer], MD        Past Medical History:  Diagnosis Date   Arthritis    DVT (deep venous thrombosis) [facility])     Past Surgical History:  Procedure Laterality Date   PERIPHERAL VASCULAR THROMBECTOMY Right 06/15/2019   Procedure: PERIPHERAL VASCULAR THROMBECTOMY;  Surgeon: [employer], MD;  Location: [facility] INVASIVE CV LAB;  Service: Cardiovascular;  Laterality: Right;     Social History   Tobacco Use   Smoking status: Never   Smokeless tobacco: Never  Substance Use Topics   Alcohol use: Yes    Comment: rare   Drug use: Never      Family History  Problem Relation Age of Onset   Cancer Mother    Diabetes Mother      No Known Allergies   REVIEW OF SYSTEMS (Negative unless checked)   Constitutional: [] Weight loss  [] Fever  [] Chills Cardiac: [] Chest pain   [] Chest pressure   [] Palpitations   [] Shortness of breath when laying flat   []   Shortness of breath at rest   [] Shortness of breath with exertion. Vascular:  [] Pain in legs with walking   [] Pain in legs at rest   [] Pain in legs when laying flat   [] Claudication   [] Pain in feet when walking  [] Pain in feet at rest  [] Pain in feet when laying flat   [x] History of DVT   [x] Phlebitis   [x] Swelling in legs   [] Varicose veins   [] Non-healing ulcers Pulmonary:   [] Uses home oxygen   [] Productive cough   [] Hemoptysis   [] Wheeze  [] COPD   [] Asthma Neurologic:  [] Dizziness  [] Blackouts   [] Seizures   [] History of stroke   [] History of TIA  [] Aphasia   [] Temporary blindness   [] Dysphagia   [] Weakness or numbness in arms   [] Weakness or numbness in legs Musculoskeletal:  [x] Arthritis   [] Joint swelling   [] Joint pain   [] Low back  pain Hematologic:  [] Easy bruising  [] Easy bleeding   [] Hypercoagulable state   [] Anemic   Gastrointestinal:  [] Blood in stool   [] Vomiting blood  [] Gastroesophageal reflux/heartburn   [] Abdominal pain Genitourinary:  [] Chronic kidney disease   [] Difficult urination  [] Frequent urination  [] Burning with urination   [] Hematuria Skin:  [] Rashes   [] Ulcers   [] Wounds Psychological:  [] History of anxiety   []  History of major depression.  Physical Examination  BP (!) 143/84   Pulse 66   Resp 18   Wt (!) 328 lb (148.8 kg)   BMI 57.19 kg/m  Gen:  WD/WN, NAD.  Obese Head: [place]/AT, No temporalis wasting. Ear/Nose/Throat: Hearing grossly intact, nares w/o erythema or drainage Eyes: Conjunctiva clear. Sclera non-icteric Neck: Supple.  Trachea midline Pulmonary:  Good air movement, no use of accessory muscles.  Cardiac: RRR, no JVD Vascular:  Vessel Right Left  Radial Palpable Palpable                          PT Palpable Palpable  DP Palpable Palpable   Gastrointestinal: soft, non-tender/non-distended. No guarding/reflex.  Musculoskeletal: M/S 5/5 throughout.  No deformity or atrophy.  No appreciable edema. Neurologic: Sensation grossly intact in extremities.  Symmetrical.  Speech is fluent.  Psychiatric: Judgment intact, Mood & affect appropriate for pt's clinical situation. Dermatologic: No rashes or ulcers noted.  No cellulitis or open wounds.      Labs No results found for this or any previous visit (from the past 2160 hours).  Radiology No results found.  Assessment/Plan  DVT (deep venous thrombosis) (HCC) Previous history of unprovoked DVT and on rheumatoid arthritis medications and with her large size would be at risk of recurrent DVT.  We are going to continue the prophylactic dose of Eliquis  indefinitely.  New prescription for 2.5 mg twice daily of Eliquis  sent in today.  Seronegative rheumatoid arthritis (HCC) Not being able to take NSAIDs has been problematic as  she has not been able to get her infusions and her RA has had intermittent flares.  Lowering her dose of Eliquis  allowed her to take intermittent NSAIDs with a low risk of bleeding.   Mikki Alexander, MD  07/14/2023 1:46 PM    This note was created with Dragon medical transcription system.  Any errors from dictation are purely unintentional

## 2023-07-16 DIAGNOSIS — M0600 Rheumatoid arthritis without rheumatoid factor, unspecified site: Secondary | ICD-10-CM | POA: Diagnosis not present

## 2023-08-13 DIAGNOSIS — M0600 Rheumatoid arthritis without rheumatoid factor, unspecified site: Secondary | ICD-10-CM | POA: Diagnosis not present

## 2023-08-17 DIAGNOSIS — R0781 Pleurodynia: Secondary | ICD-10-CM | POA: Diagnosis not present

## 2023-08-17 DIAGNOSIS — M0609 Rheumatoid arthritis without rheumatoid factor, multiple sites: Secondary | ICD-10-CM | POA: Diagnosis not present

## 2023-08-17 DIAGNOSIS — Z79899 Other long term (current) drug therapy: Secondary | ICD-10-CM | POA: Diagnosis not present

## 2023-09-10 DIAGNOSIS — M0600 Rheumatoid arthritis without rheumatoid factor, unspecified site: Secondary | ICD-10-CM | POA: Diagnosis not present

## 2023-10-05 DIAGNOSIS — K219 Gastro-esophageal reflux disease without esophagitis: Secondary | ICD-10-CM | POA: Diagnosis not present

## 2023-10-08 DIAGNOSIS — M0600 Rheumatoid arthritis without rheumatoid factor, unspecified site: Secondary | ICD-10-CM | POA: Diagnosis not present

## 2023-11-05 DIAGNOSIS — M0600 Rheumatoid arthritis without rheumatoid factor, unspecified site: Secondary | ICD-10-CM | POA: Diagnosis not present

## 2023-12-03 DIAGNOSIS — M0600 Rheumatoid arthritis without rheumatoid factor, unspecified site: Secondary | ICD-10-CM | POA: Diagnosis not present

## 2023-12-21 DIAGNOSIS — R0781 Pleurodynia: Secondary | ICD-10-CM | POA: Diagnosis not present

## 2023-12-21 DIAGNOSIS — Z79899 Other long term (current) drug therapy: Secondary | ICD-10-CM | POA: Diagnosis not present

## 2023-12-21 DIAGNOSIS — M7581 Other shoulder lesions, right shoulder: Secondary | ICD-10-CM | POA: Diagnosis not present

## 2023-12-21 DIAGNOSIS — M0609 Rheumatoid arthritis without rheumatoid factor, multiple sites: Secondary | ICD-10-CM | POA: Diagnosis not present

## 2023-12-21 DIAGNOSIS — Z111 Encounter for screening for respiratory tuberculosis: Secondary | ICD-10-CM | POA: Diagnosis not present

## 2023-12-31 DIAGNOSIS — M0600 Rheumatoid arthritis without rheumatoid factor, unspecified site: Secondary | ICD-10-CM | POA: Diagnosis not present

## 2024-07-12 ENCOUNTER — Ambulatory Visit (INDEPENDENT_AMBULATORY_CARE_PROVIDER_SITE_OTHER): Admitting: Vascular Surgery
